# Patient Record
Sex: Female | Born: 1990
Health system: Southern US, Community
[De-identification: ages and names within clinical notes are randomized; demographics above are authoritative.]

## PROBLEM LIST (undated history)

## (undated) ENCOUNTER — Inpatient Hospital Stay (HOSPITAL_COMMUNITY): Payer: Self-pay

## (undated) DIAGNOSIS — N390 Urinary tract infection, site not specified: Secondary | ICD-10-CM

## (undated) DIAGNOSIS — F419 Anxiety disorder, unspecified: Secondary | ICD-10-CM

## (undated) HISTORY — PX: OTHER SURGICAL HISTORY: SHX169

## (undated) HISTORY — PX: WISDOM TOOTH EXTRACTION: SHX21

---
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
## 2009-05-29 ENCOUNTER — Encounter: Admission: RE | Admit: 2009-05-29 | Discharge: 2009-05-29 | Payer: Self-pay | Admitting: Family Medicine

## 2010-01-08 IMAGING — CR DG THORACOLUMBAR SPINE STANDING SCOLIOSIS
1 series · 3 of 3 positions shown · non-contrast
Comparison: None

CLINICAL DATA: Suspect scoliosis.  Back pain.

THORACOLUMBAR SCOLIOSIS STUDY - STANDING VIEWS

[Series 1001: view not recorded · 0.40mm/px · 3 of 3 slices shown]
[im 1/3]
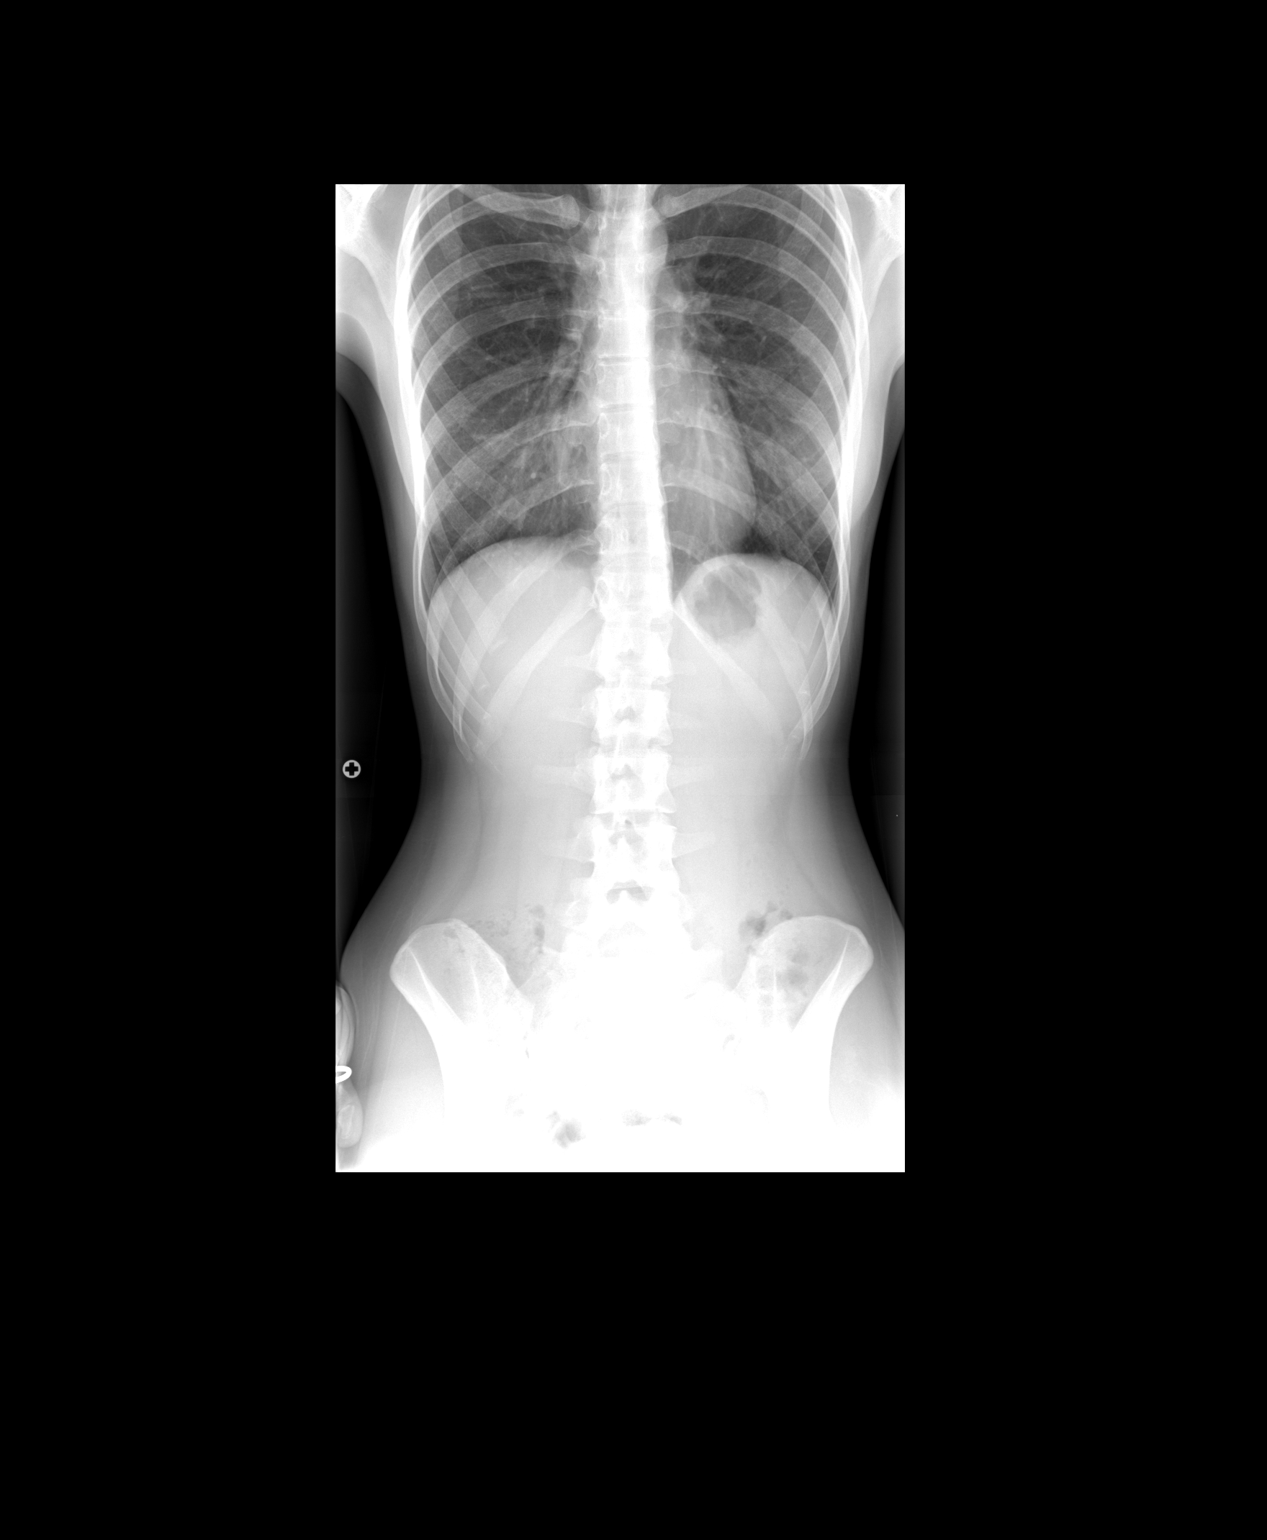
[im 2/3]
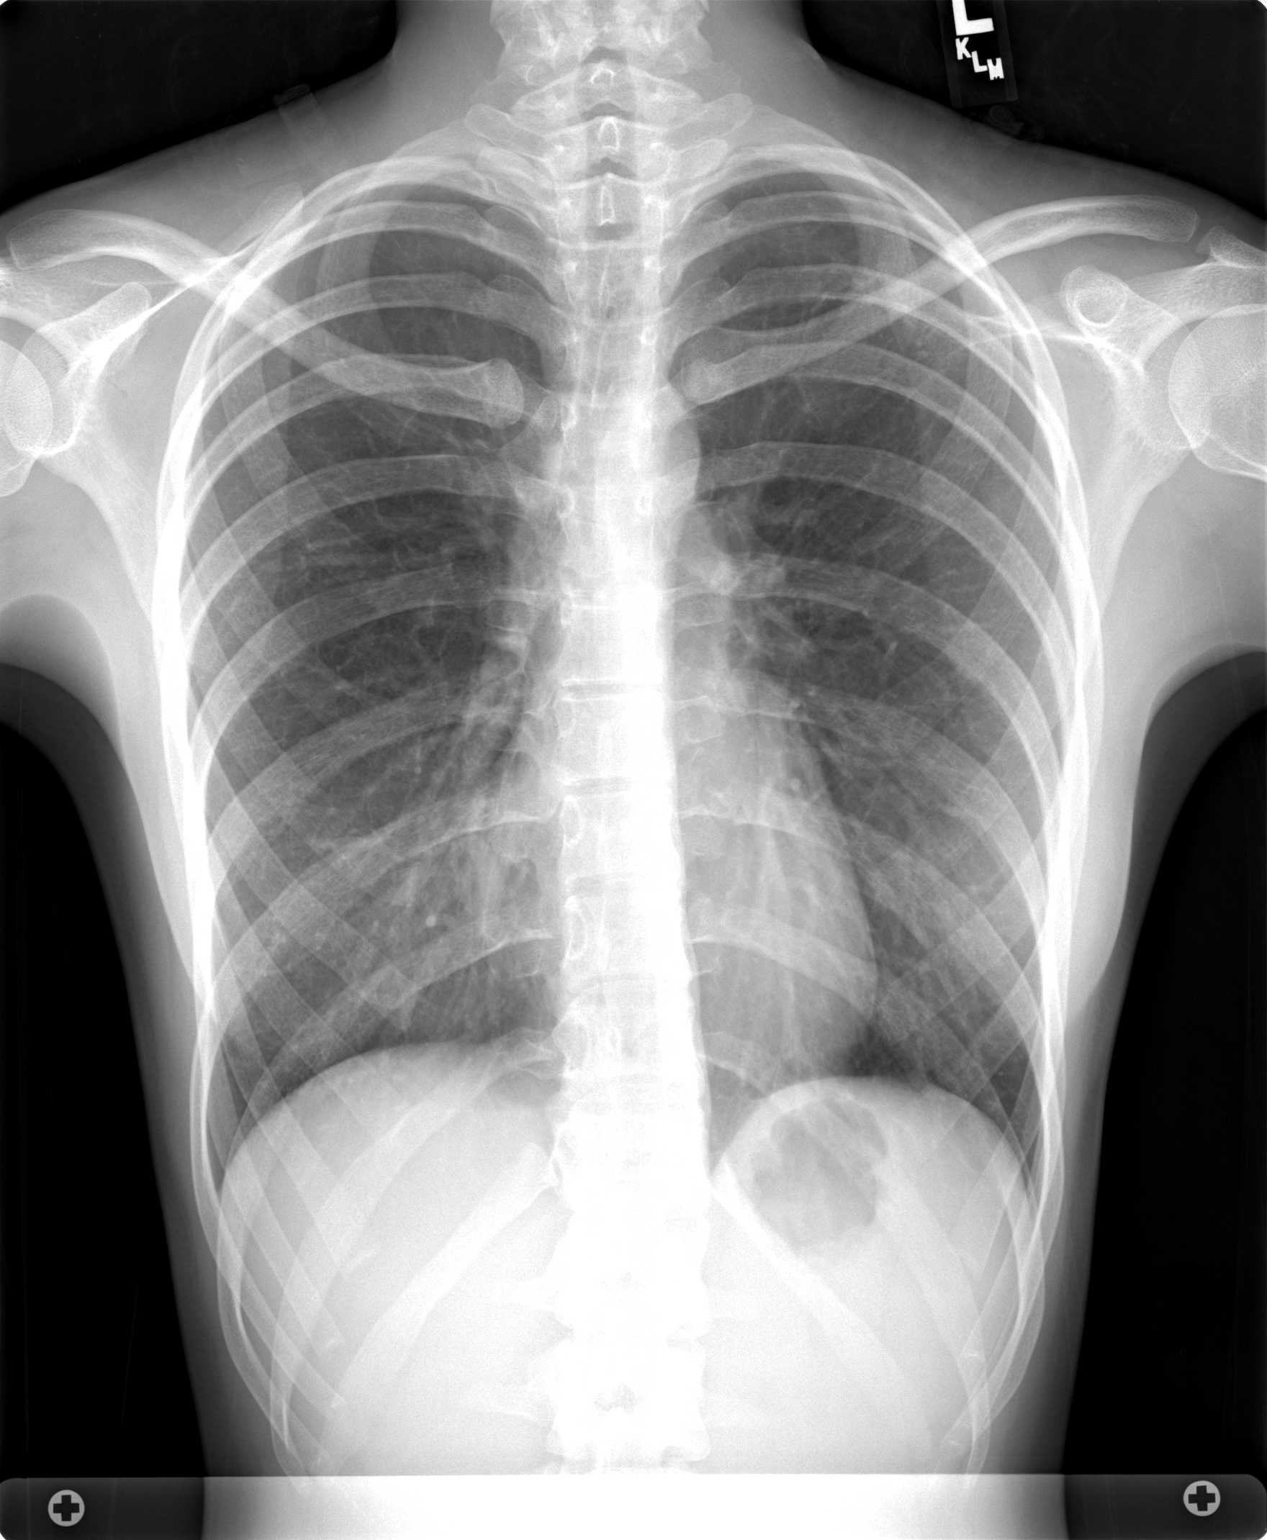
[im 3/3]
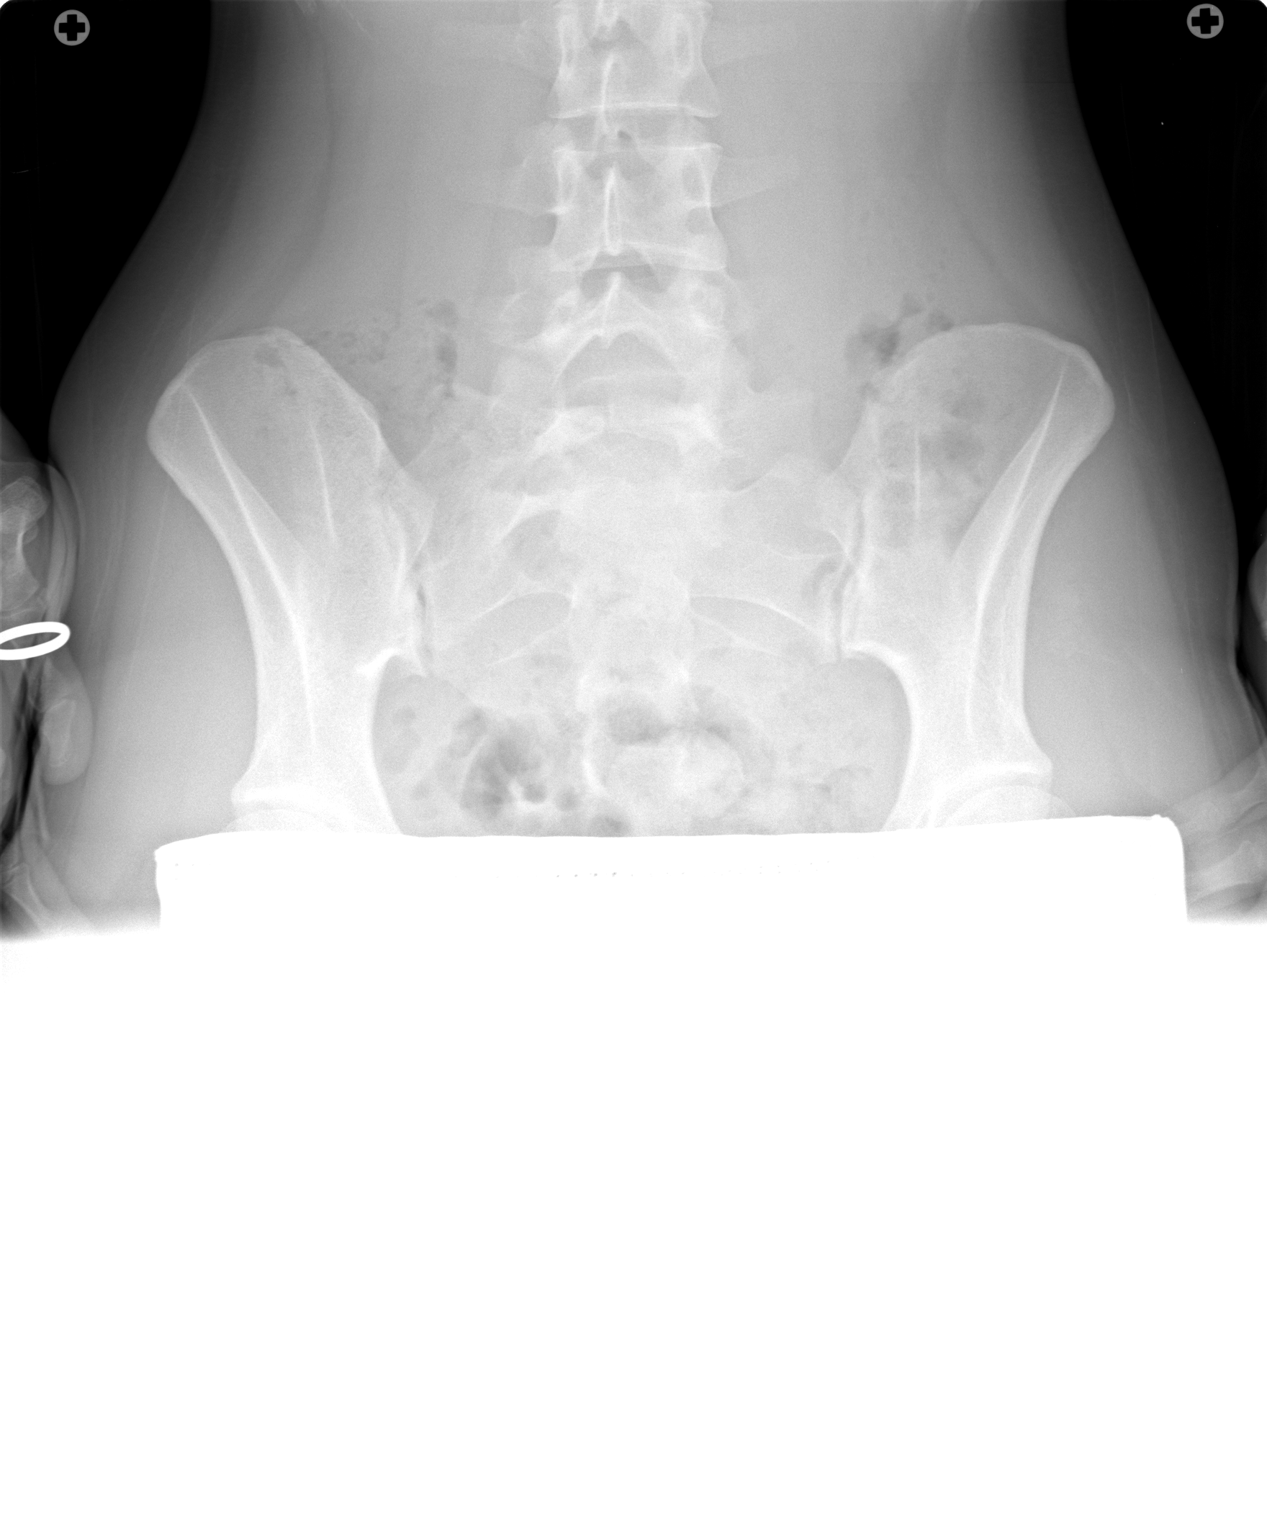

[3 of 3 positions shown; findings below may reference images not displayed]

FINDINGS: No significant scoliosis is noted.  There is very minimal
scoliotic curvature with convexity to the left in the lower
thoracic / upper lumbar region.  No segmentation anomalies.  No
dysraphism.
IMPRESSION: Minimal thoracolumbar levoscoliosis.

## 2011-05-01 NOTE — Consult Note (Signed)
[facility]  Patient:    Laura Hall, Laura Hall Visit Number: [id number] MRN: [id number]          Service Type: EXP Location: ED Attending Physician:  Pearletha Alfred Dictated by:   Jeannett Senior Pollyann Kennedy, M.D. Proc. Date: 09/03/01 Admit Date:  09/03/2001 Discharge Date: 09/03/2001               Sallye Ober A. Twiselton, M.D.   Consultation Report  TIME OF CONSULTATION:  2 p.m., September 03, 2001.  SITE:  [facility]  REASON FOR CONSULTATION:  Nasal injury, suspicious for septal hematoma.  REFERRING PHYSICIAN:  Louise A. Twiselton, M.D.  HISTORY:  This is a 20 year old girl who was hit in the nose by a softball about 10 oclock this morning.  She was seen in the pediatricians office, and there was concern about the possibility of a septal hematoma.  She seems to be breathing reasonably well through her nose.  There is minimal bleeding right now.  PAST MEDICAL HISTORY:  Recent ear infection on the left side and currently is on antibiotic treatment for that.  She is otherwise in good health.  PHYSICAL EXAMINATION:  GENERAL:  She is a pleasant, cooperative, healthy-appearing young lady.  HEENT:  Examination of the ear reveals a serous otitis media on the left side without any evidence of acute infection or retraction or granulation tissue. The right side looks clear.  Intranasal exam looks clear.  There are small scattered areas of some bloody discharge but no active bleeding.  The septum is thin and midline without any evidence of hematoma.  The nasal dorsum is ecchymotic on the left side in particular with some ecchymosis up into the medial periorbital eyelid area. There appears that there may be slight depression of the left nasal bone and probable lateral displacement of the right nasal bone.  The remainder of the face is unremarkable.  There is no evidence of orbital rim fracture, no hypesthesia of the mid face or forehead  area.  IMPRESSION:  Nasal trauma, possible displaced nasal fracture.  No evidence of septal hematoma.  RECOMMENDATIONS:  Ice the nose for the next 24 to 48 hours.  Recommend followup in the office in the middle of next week for reevaluation after the swelling has gone down to see if a closed reduction will be necessary. Dictated by:   Jeannett Senior Pollyann Kennedy, M.D. Attending Physician:  [place] B DD:  09/03/01 TD:  09/03/01 Job: [id number]

## 2013-01-23 ENCOUNTER — Emergency Department
Admission: EM | Admit: 2013-01-23 | Discharge: 2013-01-23 | Disposition: A | Discharge status: Home / Self Care | Payer: 59 | Source: Home / Self Care | Attending: Family Medicine | Admitting: Family Medicine

## 2013-01-23 ENCOUNTER — Encounter: Payer: Self-pay | Admitting: *Deleted

## 2013-01-23 DIAGNOSIS — J029 Acute pharyngitis, unspecified: Secondary | ICD-10-CM

## 2013-01-23 DIAGNOSIS — J069 Acute upper respiratory infection, unspecified: Secondary | ICD-10-CM

## 2013-01-23 HISTORY — DX: Anxiety disorder, unspecified: F41.9

## 2013-01-23 LAB — POCT RAPID STREP A (OFFICE): Rapid Strep A Screen: NEGATIVE

## 2013-01-23 MED ORDER — BENZONATATE 200 MG PO CAPS: 200.0000 mg | ORAL_CAPSULE | Freq: Every day | ORAL | Status: DC

## 2013-01-23 MED ORDER — AMOXICILLIN 875 MG PO TABS: 875.0000 mg | ORAL_TABLET | Freq: Two times a day (BID) | ORAL | Status: DC

## 2013-01-23 NOTE — ED Notes (Signed)
Patient c/o congestion, sinus and teeth pain, HA x 3 days. Taken Dayquil today. Denies fever.

## 2013-01-23 NOTE — ED Provider Notes (Signed)
History     CSN: [id number]  Arrival date & time 01/23/13  1411   None     Chief Complaint  Patient presents with  . Sinus Problem       HPI Comments: Patient complains of 3 day history of gradually progressive URI symptoms beginning with a mild sore throat (now improved), followed by progressive nasal congestion.  A mild cough started yesterday. Complains of fatigue but no myalgias.  Cough is now worse at night and generally non-productive during the day.  There has been no pleuritic pain, shortness of breath, or wheezes.   The history is provided by the patient.    Past Medical History  Diagnosis Date  . Anxiety     Past Surgical History  Procedure Laterality Date  . Nose set    . Wisdom tooth extraction      Family History  Problem Relation Age of Onset  . Hypertension Father   . Hyperlipidemia Father     History  Substance Use Topics  . Smoking status: Never Smoker   . Smokeless tobacco: Never Used  . Alcohol Use: Yes    OB History   Grav Para Term Preterm Abortions TAB SAB Ect Mult Living                  Review of Systems + sore throat + cough No pleuritic pain No wheezing + nasal congestion + post-nasal drainage + sinus pain/pressure No itchy/red eyes + earache No hemoptysis No SOB No fever/chills No nausea No vomiting No abdominal pain No diarrhea No urinary symptoms No skin rashes + fatigue No myalgias + headache Used OTC meds without relief  Allergies  Review of patient's allergies indicates no known allergies.  Home Medications   Current Outpatient Rx  Name  Route  Sig  Dispense  Refill  . citalopram (CELEXA) 20 MG tablet   Oral   Take 20 mg by mouth daily.         Marland Kitchen etonogestrel-ethinyl estradiol (NUVARING) 0.12-0.015 MG/24HR vaginal ring   Vaginal   Place 1 each vaginally every 28 (twenty-eight) days. Insert vaginally and leave in place for 3 consecutive weeks, then remove for 1 week.         Marland Kitchen amoxicillin  (AMOXIL) 875 MG tablet   Oral   Take 1 tablet (875 mg total) by mouth 2 (two) times daily. (Rx void after 01/31/13)   20 tablet   0   . benzonatate (TESSALON) 200 MG capsule   Oral   Take 1 capsule (200 mg total) by mouth at bedtime. Take as needed for cough   12 capsule   0     BP 122/85  Pulse 73  Temp(Src) 98.5 F (36.9 C) (Oral)  Ht 5\' 8"  (1.727 m)  Wt 136 lb (61.689 kg)  BMI 20.68 kg/m2  SpO2 98%  LMP 01/20/2013  Physical Exam Nursing notes and Vital Signs reviewed. Appearance:  Patient appears healthy, stated age, and in no acute distress Eyes:  Pupils are equal, round, and reactive to light and accomodation.  Extraocular movement is intact.  Conjunctivae are not inflamed  Ears:  Canals normal.  Tympanic membranes normal.  Nose:  Mildly congested turbinates.  No sinus tenderness.   Pharynx:  Normal Neck:  Supple.  Slightly tender shotty posterior nodes are palpated bilaterally  Lungs:  Clear to auscultation.  Breath sounds are equal.  Heart:  Regular rate and rhythm without murmurs, rubs, or gallops.  Abdomen:  Nontender without  masses or hepatosplenomegaly.  Bowel sounds are present.  No CVA or flank tenderness.  Extremities:  No edema.  No calf tenderness Skin:  No rash present.   ED Course  Procedures none   Labs Reviewed  STREP A DNA PROBE negative  POCT RAPID STREP A (OFFICE) pending      1. Acute upper respiratory infections of unspecified site   2. Acute pharyngitis       MDM  Throat culture pending There is no evidence of bacterial infection today.   Treat symptomatically for now: Take Mucinex D (guaifenesin with decongestant) twice daily for congestion (or use plain Mucinex and Sudafed).  Increase fluid intake, rest. May use Afrin nasal spray (or generic oxymetazoline) twice daily for about 5 days.  Also recommend using saline nasal spray several times daily and saline nasal irrigation (AYR is a common brand) Stop all antihistamines for now,  and other non-prescription cough/cold preparations. Begin Amoxicillin if not improving about 5 days or if persistent fever develops (Given a prescription to hold, with an expiration date)  Follow-up with family doctor if not improving about10 days.         Lattie Haw, MD 01/27/13 [phone]

## 2013-01-24 LAB — STREP A DNA PROBE: GASP: NEGATIVE

## 2013-05-19 ENCOUNTER — Telehealth: Payer: Self-pay | Admitting: Obstetrics and Gynecology

## 2013-05-19 NOTE — Telephone Encounter (Signed)
Yes, she certainly may use nuva back to back.

## 2013-05-19 NOTE — Telephone Encounter (Signed)
Nuvaring question for nurse: Can she use back to back instead of taking a week's break?

## 2013-05-19 NOTE — Telephone Encounter (Signed)
PATIENT NOTIFIED OF DR. Tresa Res RESPONSE CONCERNING NUVARING.

## 2013-06-21 ENCOUNTER — Telehealth: Payer: Self-pay | Admitting: Obstetrics and Gynecology

## 2013-06-21 NOTE — Telephone Encounter (Signed)
Nuvaring--question about lubricants to use with? Patient getting ready to go on honeymoon.

## 2013-06-21 NOTE — Telephone Encounter (Signed)
Patient last AEX 11/16/2012 with instructions of Nuvaring. Getting married this month and will be on honeymoon. Needing to know what kind of lubricant she may use if needed for SA or is this OK to do with a Nuvaring of using lubricant. States she is having no problems with nuvaring. please advise. Chart in your cabinet.

## 2013-06-22 ENCOUNTER — Telehealth: Payer: Self-pay | Admitting: Obstetrics and Gynecology

## 2013-06-22 NOTE — Telephone Encounter (Signed)
pt is still waiting since yesterday for a call from the nurse

## 2013-06-23 NOTE — Telephone Encounter (Signed)
Patient notified of response of Dr. Tresa Res of [place] to use [facility]. Patient understands.

## 2013-06-23 NOTE — Telephone Encounter (Signed)
Ok to use [employer]

## 2013-11-15 ENCOUNTER — Telehealth: Payer: Self-pay | Admitting: *Deleted

## 2013-11-15 ENCOUNTER — Telehealth: Payer: Self-pay | Admitting: Gynecology

## 2013-11-15 MED ORDER — ETONOGESTREL-ETHINYL ESTRADIOL 0.12-0.015 MG/24HR VA RING: VAGINAL_RING | VAGINAL | Status: DC

## 2013-11-15 NOTE — Telephone Encounter (Signed)
Pt requesting refill on the Nuvaring. [phone]. Rx may have come under her married name of Igou.

## 2013-11-15 NOTE — Telephone Encounter (Signed)
Annual exam scheduled for 12/29/12 Rx sent to [facility] . Nuvaring 1 x 1 refill.

## 2013-12-27 ENCOUNTER — Encounter: Payer: Self-pay | Admitting: Obstetrics and Gynecology

## 2013-12-29 ENCOUNTER — Ambulatory Visit (INDEPENDENT_AMBULATORY_CARE_PROVIDER_SITE_OTHER): Payer: 59 | Admitting: Gynecology

## 2013-12-29 ENCOUNTER — Encounter: Payer: Self-pay | Admitting: Gynecology

## 2013-12-29 VITALS — BP 104/68 | HR 74 | Resp 16 | Ht 69.25 in | Wt 132.0 lb

## 2013-12-29 DIAGNOSIS — Z01419 Encounter for gynecological examination (general) (routine) without abnormal findings: Secondary | ICD-10-CM

## 2013-12-29 DIAGNOSIS — Z124 Encounter for screening for malignant neoplasm of cervix: Secondary | ICD-10-CM

## 2013-12-29 DIAGNOSIS — Z309 Encounter for contraceptive management, unspecified: Secondary | ICD-10-CM

## 2013-12-29 MED ORDER — ETONOGESTREL-ETHINYL ESTRADIOL 0.12-0.015 MG/24HR VA RING: VAGINAL_RING | VAGINAL | Status: DC

## 2013-12-29 NOTE — Progress Notes (Signed)
23 y.o. [name]   G0P0000 here for annual exam. Pt is currently sexually active.  She reports using condoms when she's on her off week of Nuvaring.  First sexual activity at 1926 , 1 number of lifetime partners   Pt was given rx for bactrim after sex, but hasn't used 1 month ago.  Pt happy with Nurvaring.  Patient's last menstrual period was 12/05/2013.          Sexually active: yes  The current method of family planning is NuvaRing vaginal inserts.    Exercising: yes  treadmill, yoga occ Last pap: 05/26/2010  Alcohol: 1-2 drinks/wl (socially) Tobacco: no Drugs: no Gardisil: yes, completed: 2009  Labs: PCP    Health Maintenance  Topic Date Due  . Pap Smear  06/16/2007  . Tetanus/tdap  06/15/2008  . Influenza Vaccine  07/14/2013    Family History  Problem Relation Age of Onset  . Hypertension Father   . Hyperlipidemia Father   . Hypertension Paternal Grandfather     There are no active problems to display for this patient.   Past Medical History  Diagnosis Date  . Anxiety     Past Surgical History  Procedure Laterality Date  . Nose set    . Wisdom tooth extraction      Allergies: Review of patient's allergies indicates no known allergies.  Current Outpatient Prescriptions  Medication Sig Dispense Refill  . etonogestrel-ethinyl estradiol (NUVARING) 0.12-0.015 MG/24HR vaginal ring Insert vaginally and leave in place for 3 consecutive weeks, then remove for 1 week.  1 each  1  . SERTRALINE HCL PO Take 35 mg by mouth.      . sulfamethoxazole-trimethoprim (BACTRIM DS) 800-160 MG per tablet Take 1 tablet by mouth as needed.      . Adapalene-Benzoyl Peroxide (EPIDUO) 0.1-2.5 % gel Apply topically as needed.      Marland Kitchen. amoxicillin (AMOXIL) 875 MG tablet Take 1 tablet (875 mg total) by mouth 2 (two) times daily. (Rx void after 01/31/13)  20 tablet  0  . benzonatate (TESSALON) 200 MG capsule Take 1 capsule (200 mg total) by mouth at bedtime. Take as needed for cough   12 capsule  0  . citalopram (CELEXA) 20 MG tablet Take 20 mg by mouth daily.       No current facility-administered medications for this visit.    ROS: Pertinent items are noted in HPI.  Exam:    BP 104/68  Pulse 74  Resp 16  Ht 5' 9.25" (1.759 m)  Wt 132 lb (59.875 kg)  BMI 19.35 kg/m2  LMP 12/05/2013 Weight change: @WEIGHTCHANGE @ Last 3 height recordings:  Ht Readings from Last 3 Encounters:  12/29/13 5' 9.25" (1.759 m)  01/23/13 5\' 8"  (1.727 m)   General appearance: alert, cooperative and appears stated age Head: Normocephalic, without obvious abnormality, atraumatic Neck: no adenopathy, no carotid bruit, no JVD, supple, symmetrical, trachea midline and thyroid not enlarged, symmetric, no tenderness/mass/nodules Lungs: clear to auscultation bilaterally Breasts: normal appearance, no masses or tenderness Heart: regular rate and rhythm, S1, S2 normal, no murmur, click, rub or gallop Abdomen: soft, non-tender; bowel sounds normal; no masses,  no organomegaly Extremities: extremities normal, atraumatic, no cyanosis or edema Skin: Skin color, texture, turgor normal. No rashes or lesions Lymph nodes: Cervical, supraclavicular, and axillary nodes normal. no inguinal nodes palpated Neurologic: Grossly normal   Pelvic: External genitalia:  no lesions              Urethra: normal  appearing urethra with no masses, tenderness or lesions              Bartholins and Skenes: normal                 Vagina: normal appearing vagina with normal color and discharge, no lesions              Cervix: normal appearance              Pap taken: yes        Bimanual Exam:  Uterus:  uterus is normal size, shape, consistency and nontender                                      Adnexa:    normal adnexa in size, nontender and no masses                                      Rectovaginal: Confirms                                      Anus:  normal sphincter tone, no lesions  A: well woman no  contraindication to continue use of oral contraceptives Contraceptive management     P: pap smear with reflex counseled on breast self exam, mammography screening, adequate intake of calcium and vitamin D, diet and exercise return annually or prn Discussed STD prevention, regular condom use.     An After Visit Summary was printed and given to the patient.

## 2014-01-02 LAB — IPS PAP TEST WITH REFLEX TO HPV

## 2014-11-15 ENCOUNTER — Encounter: Payer: Self-pay | Admitting: Nurse Practitioner

## 2014-11-15 ENCOUNTER — Ambulatory Visit (INDEPENDENT_AMBULATORY_CARE_PROVIDER_SITE_OTHER): Payer: 59 | Admitting: Nurse Practitioner

## 2014-11-15 VITALS — BP 114/78 | HR 72 | Wt 138.0 lb

## 2014-11-15 DIAGNOSIS — B9689 Other specified bacterial agents as the cause of diseases classified elsewhere: Secondary | ICD-10-CM

## 2014-11-15 DIAGNOSIS — N760 Acute vaginitis: Secondary | ICD-10-CM

## 2014-11-15 DIAGNOSIS — N926 Irregular menstruation, unspecified: Secondary | ICD-10-CM

## 2014-11-15 DIAGNOSIS — A499 Bacterial infection, unspecified: Secondary | ICD-10-CM

## 2014-11-15 MED ORDER — METRONIDAZOLE 0.75 % VA GEL: 1.0000 | Freq: Every day | VAGINAL | Status: DC

## 2014-11-15 NOTE — Patient Instructions (Signed)
Bacterial Vaginosis Bacterial vaginosis is a vaginal infection that occurs when the normal balance of bacteria in the vagina is disrupted. It results from an overgrowth of certain bacteria. This is the most common vaginal infection in women of childbearing age. Treatment is important to prevent complications, especially in pregnant women, as it can cause a premature delivery. CAUSES  Bacterial vaginosis is caused by an increase in harmful bacteria that are normally present in smaller amounts in the vagina. Several different kinds of bacteria can cause bacterial vaginosis. However, the reason that the condition develops is not fully understood. RISK FACTORS Certain activities or behaviors can put you at an increased risk of developing bacterial vaginosis, including:  Having a new sex partner or multiple sex partners.  Douching.  Using an intrauterine device (IUD) for contraception. Women do not get bacterial vaginosis from toilet seats, bedding, swimming pools, or contact with objects around them. SIGNS AND SYMPTOMS  Some women with bacterial vaginosis have no signs or symptoms. Common symptoms include:  Grey vaginal discharge.  A fishlike odor with discharge, especially after sexual intercourse.  Itching or burning of the vagina and vulva.  Burning or pain with urination. DIAGNOSIS  Your health care provider will take a medical history and examine the vagina for signs of bacterial vaginosis. A sample of vaginal fluid may be taken. Your health care provider will look at this sample under a microscope to check for bacteria and abnormal cells. A vaginal pH test may also be done.  TREATMENT  Bacterial vaginosis may be treated with antibiotic medicines. These may be given in the form of a pill or a vaginal cream. A second round of antibiotics may be prescribed if the condition comes back after treatment.  HOME CARE INSTRUCTIONS   Only take over-the-counter or prescription medicines as  directed by your health care provider.  If antibiotic medicine was prescribed, take it as directed. Make sure you finish it even if you start to feel better.  Do not have sex until treatment is completed.  Tell all sexual partners that you have a vaginal infection. They should see their health care provider and be treated if they have problems, such as a mild rash or itching.  Practice safe sex by using condoms and only having one sex partner. SEEK MEDICAL CARE IF:   Your symptoms are not improving after 3 days of treatment.  You have increased discharge or pain.  You have a fever. MAKE SURE YOU:   Understand these instructions.  Will watch your condition.  Will get help right away if you are not doing well or get worse. FOR MORE INFORMATION  Centers for Disease Control and Prevention, Division of STD Prevention: www.cdc.gov/std American Sexual Health Association (ASHA): www.ashastd.org  Document Released: 11/30/2005 Document Revised: 09/20/2013 Document Reviewed: 07/12/2013 ExitCare Patient Information 2015 [employer]. This information is not intended to replace advice given to you by your health care provider. Make sure you discuss any questions you have with your health care provider.  

## 2014-11-15 NOTE — Progress Notes (Signed)
23 y.o. MW Fe G0 here with complaint of vaginal symptoms of menstrual irregularity with an odor since last menses.  Describes discharge as brown to light flow.  LMP 10/28/14.  On Nuva Ring for 1.5 years and cycles normally last for a week.  In August started menses earlier and lasted to 2 weeks.  She states she was tole]d to leave the Nuva ring in until the 28 th of the month regardless of where she was in the cycle and then remove.  Then instructed to insert a new ring on the first day of next month regardless of where she was on the cycle. Has been doing this about the same time of this irregular bleeding. Onset of symptoms about a few weeks ago days ago. Denies new personal products or vaginal dryness. No  STD concerns. Urinary symptoms none .   O:Healthy female WDWN Affect: normal, orientation x 3  Exam: Abdomen: Lymph node: no enlargement or tenderness Pelvic exam: External genital:  BUS: negative Vagina: thin clear to grey discharge noted. Wet prep taken Cervix: normal, non tender Uterus: normal, non tender   Wet Prep results: NSS: + clue cells, KOH negative yeast, PH: 5.0 UPT: negative  A:  BV  Irregular bleeding on Nuva Ring (negative UPT)  P:  Discussed findings of BV and etiology. Discussed Aveeno or baking soda sitz bath for comfort. Avoid moist clothes or pads for extended period of time. If working out in gym clothes or swim suits for long periods of time change underwear or bottoms of swimsuit if possible. Olive Oil use for skin protection prior to activity can be used to external skin.  Rx: Metrogel vagina cream HS X 5  She is advised to remove the Nuva Ring at three week cycle an reinsert a new ring a week later.  The BTB should improve wit this - if not to call back.  RV prn

## 2014-11-18 NOTE — Progress Notes (Signed)
Encounter reviewed by Dr. Brook Silva.  

## 2015-01-14 ENCOUNTER — Other Ambulatory Visit: Payer: Self-pay | Admitting: *Deleted

## 2015-01-14 MED ORDER — ETONOGESTREL-ETHINYL ESTRADIOL 0.12-0.015 MG/24HR VA RING: VAGINAL_RING | VAGINAL | Status: DC

## 2015-01-14 NOTE — Telephone Encounter (Signed)
Incoming fax from [facility] requesting Nuvaring refill  Medication refill request: Nuvaring Last AEX:  12/29/13 with TL Next AEX: 01/24/15 with DL Last MMG (if hormonal medication request): None Refill authorized: 12/29/13  #3/3R. Today #1/0R?   [facility]

## 2015-01-17 ENCOUNTER — Ambulatory Visit: Payer: 59 | Admitting: Nurse Practitioner

## 2015-01-24 ENCOUNTER — Ambulatory Visit (INDEPENDENT_AMBULATORY_CARE_PROVIDER_SITE_OTHER): Payer: 59 | Admitting: Certified Nurse Midwife

## 2015-01-24 ENCOUNTER — Encounter: Payer: Self-pay | Admitting: Certified Nurse Midwife

## 2015-01-24 VITALS — BP 112/66 | HR 68 | Resp 16 | Ht 67.75 in | Wt 138.0 lb

## 2015-01-24 DIAGNOSIS — Z01419 Encounter for gynecological examination (general) (routine) without abnormal findings: Secondary | ICD-10-CM

## 2015-01-24 DIAGNOSIS — B9689 Other specified bacterial agents as the cause of diseases classified elsewhere: Secondary | ICD-10-CM

## 2015-01-24 DIAGNOSIS — A499 Bacterial infection, unspecified: Secondary | ICD-10-CM

## 2015-01-24 DIAGNOSIS — Z3049 Encounter for surveillance of other contraceptives: Secondary | ICD-10-CM

## 2015-01-24 DIAGNOSIS — Z0000 Encounter for general adult medical examination without abnormal findings: Secondary | ICD-10-CM

## 2015-01-24 DIAGNOSIS — N760 Acute vaginitis: Secondary | ICD-10-CM

## 2015-01-24 MED ORDER — ETONOGESTREL-ETHINYL ESTRADIOL 0.12-0.015 MG/24HR VA RING: VAGINAL_RING | VAGINAL | Status: DC

## 2015-01-24 NOTE — Patient Instructions (Signed)
General topics  Next pap or exam is  due in 1 year Take a Women's multivitamin Take 1200 mg. of calcium daily - prefer dietary If any concerns in interim to call back  Breast Self-Awareness Practicing breast self-awareness may pick up problems early, prevent significant medical complications, and possibly save your life. By practicing breast self-awareness, you can become familiar with how your breasts look and feel and if your breasts are changing. This allows you to notice changes early. It can also offer you some reassurance that your breast health is good. One way to learn what is normal for your breasts and whether your breasts are changing is to do a breast self-exam. If you find a lump or something that was not present in the past, it is best to contact your caregiver right away. Other findings that should be evaluated by your caregiver include nipple discharge, especially if it is bloody; skin changes or reddening; areas where the skin seems to be pulled in (retracted); or new lumps and bumps. Breast pain is seldom associated with cancer (malignancy), but should also be evaluated by a caregiver. BREAST SELF-EXAM The best time to examine your breasts is 5 7 days after your menstrual period is over.  ExitCare Patient Information 2013 ExitCare, LLC.   Exercise to Stay Healthy Exercise helps you become and stay healthy. EXERCISE IDEAS AND TIPS Choose exercises that:  You enjoy.  Fit into your day. You do not need to exercise really hard to be healthy. You can do exercises at a slow or medium level and stay healthy. You can:  Stretch before and after working out.  Try yoga, Pilates, or tai chi.  Lift weights.  Walk fast, swim, jog, run, climb stairs, bicycle, dance, or rollerskate.  Take aerobic classes. Exercises that burn about 150 calories:  Running 1  miles in 15 minutes.  Playing volleyball for 45 to 60 minutes.  Washing and waxing a car for 45 to 60  minutes.  Playing touch football for 45 minutes.  Walking 1  miles in 35 minutes.  Pushing a stroller 1  miles in 30 minutes.  Playing basketball for 30 minutes.  Raking leaves for 30 minutes.  Bicycling 5 miles in 30 minutes.  Walking 2 miles in 30 minutes.  Dancing for 30 minutes.  Shoveling snow for 15 minutes.  Swimming laps for 20 minutes.  Walking up stairs for 15 minutes.  Bicycling 4 miles in 15 minutes.  Gardening for 30 to 45 minutes.  Jumping rope for 15 minutes.  Washing windows or floors for 45 to 60 minutes. Document Released: 01/02/2011 Document Revised: 02/22/2012 Document Reviewed: 01/02/2011 ExitCare Patient Information 2013 ExitCare, LLC.   Other topics ( that may be useful information):    Sexually Transmitted Disease Sexually transmitted disease (STD) refers to any infection that is passed from person to person during sexual activity. This may happen by way of saliva, semen, blood, vaginal mucus, or urine. Common STDs include:  Gonorrhea.  Chlamydia.  Syphilis.  HIV/AIDS.  Genital herpes.  Hepatitis B and C.  Trichomonas.  Human papillomavirus (HPV).  Pubic lice. CAUSES  An STD may be spread by bacteria, virus, or parasite. A person can get an STD by:  Sexual intercourse with an infected person.  Sharing sex toys with an infected person.  Sharing needles with an infected person.  Having intimate contact with the genitals, mouth, or rectal areas of an infected person. SYMPTOMS  Some people may not have any symptoms, but   they can still pass the infection to others. Different STDs have different symptoms. Symptoms include:  Painful or bloody urination.  Pain in the pelvis, abdomen, vagina, anus, throat, or eyes.  Skin rash, itching, irritation, growths, or sores (lesions). These usually occur in the genital or anal area.  Abnormal vaginal discharge.  Penile discharge in men.  Soft, flesh-colored skin growths in the  genital or anal area.  Fever.  Pain or bleeding during sexual intercourse.  Swollen glands in the groin area.  Yellow skin and eyes (jaundice). This is seen with hepatitis. DIAGNOSIS  To make a diagnosis, your caregiver may:  Take a medical history.  Perform a physical exam.  Take a specimen (culture) to be examined.  Examine a sample of discharge under a microscope.  Perform blood test TREATMENT   Chlamydia, gonorrhea, trichomonas, and syphilis can be cured with antibiotic medicine.  Genital herpes, hepatitis, and HIV can be treated, but not cured, with prescribed medicines. The medicines will lessen the symptoms.  Genital warts from HPV can be treated with medicine or by freezing, burning (electrocautery), or surgery. Warts may come back.  HPV is a virus and cannot be cured with medicine or surgery.However, abnormal areas may be followed very closely by your caregiver and may be removed from the cervix, vagina, or vulva through office procedures or surgery. If your diagnosis is confirmed, your recent sexual partners need treatment. This is true even if they are symptom-free or have a negative culture or evaluation. They should not have sex until their caregiver says it is okay. HOME CARE INSTRUCTIONS  All sexual partners should be informed, tested, and treated for all STDs.  Take your antibiotics as directed. Finish them even if you start to feel better.  Only take over-the-counter or prescription medicines for pain, discomfort, or fever as directed by your caregiver.  Rest.  Eat a balanced diet and drink enough fluids to keep your urine clear or pale yellow.  Do not have sex until treatment is completed and you have followed up with your caregiver. STDs should be checked after treatment.  Keep all follow-up appointments, Pap tests, and blood tests as directed by your caregiver.  Only use latex condoms and water-soluble lubricants during sexual activity. Do not use  petroleum jelly or oils.  Avoid alcohol and illegal drugs.  Get vaccinated for HPV and hepatitis. If you have not received these vaccines in the past, talk to your caregiver about whether one or both might be right for you.  Avoid risky sex practices that can break the skin. The only way to avoid getting an STD is to avoid all sexual activity.Latex condoms and dental dams (for oral sex) will help lessen the risk of getting an STD, but will not completely eliminate the risk. SEEK MEDICAL CARE IF:   You have a fever.  You have any new or worsening symptoms. Document Released: 02/20/2003 Document Revised: 02/22/2012 Document Reviewed: 02/27/2011 [facility] Patient Information 2013 [employer].    Domestic Abuse You are being battered or abused if someone close to you hits, pushes, or physically hurts you in any way. You also are being abused if you are forced into activities. You are being sexually abused if you are forced to have sexual contact of any kind. You are being emotionally abused if you are made to feel worthless or if you are constantly threatened. It is important to remember that help is available. No one has the right to abuse you. PREVENTION OF FURTHER  ABUSE  Learn the warning signs of danger. This varies with situations but may include: the use of alcohol, threats, isolation from friends and family, or forced sexual contact. Leave if you feel that violence is going to occur.  If you are attacked or beaten, report it to the police so the abuse is documented. You do not have to press charges. The police can protect you while you or the attackers are leaving. Get the officer's name and badge number and a copy of the report.  Find someone you can trust and tell them what is happening to you: your caregiver, a nurse, clergy member, close friend or family member. Feeling ashamed is natural, but remember that you have done nothing wrong. No one deserves abuse. Document Released:  11/27/2000 Document Revised: 02/22/2012 Document Reviewed: 02/05/2011 ExitCare Patient Information 2013 ExitCare[employer] LLC.    How Much is Too Much Alcohol? Drinking too much alcohol can cause injury, accidents, and health problems. These types of problems can include:   Car crashes.  Falls.  Family fighting (domestic violence).  Drowning.  Fights.  Injuries.  Burns.  Damage to certain organs.  Having a baby with birth defects. ONE DRINK CAN BE TOO MUCH WHEN YOU ARE:  Working.  Pregnant or breastfeeding.  Taking medicines. Ask your doctor.  Driving or planning to drive. If you or someone you know has a drinking problem, get help from a doctor.  Document Released: 09/26/2009 Document Revised: 02/22/2012 Document Reviewed: 09/26/2009 ExitCare Patient Information 2013 ExitCare[employer].   Smoking Hazards Smoking cigarettes is extremely bad for your health. Tobacco smoke has over 200 known poisons in it. There are over 60 chemicals in tobacco smoke that cause cancer. Some of the chemicals found in cigarette smoke include:   Cyanide.  Benzene.  Formaldehyde.  Methanol (wood alcohol).  Acetylene (fuel used in welding torches).  Ammonia. Cigarette smoke also contains the poisonous gases nitrogen oxide and carbon monoxide.  Cigarette smokers have an increased risk of many serious medical problems and Smoking causes approximately:  90% of all lung cancer deaths in men.  80% of all lung cancer deaths in women.  90% of deaths from chronic obstructive lung disease. Compared with nonsmokers, smoking increases the risk of:  Coronary heart disease by 2 to 4 times.  Stroke by 2 to 4 times.  Men developing lung cancer by 23 times.  Women developing lung cancer by 13 times.  Dying from chronic obstructive lung diseases by 12 times.  . Smoking is the most preventable cause of death and disease in our society.  WHY IS SMOKING ADDICTIVE?  Nicotine is the chemical  agent in tobacco that is capable of causing addiction or dependence.  When you smoke and inhale, nicotine is absorbed rapidly into the bloodstream through your lungs. Nicotine absorbed through the lungs is capable of creating a powerful addiction. Both inhaled and non-inhaled nicotine may be addictive.  Addiction studies of cigarettes and spit tobacco show that addiction to nicotine occurs mainly during the teen years, when young people begin using tobacco products. WHAT ARE THE BENEFITS OF QUITTING?  There are many health benefits to quitting smoking.   Likelihood of developing cancer and heart disease decreases. Health improvements are seen almost immediately.  Blood pressure, pulse rate, and breathing patterns start returning to normal soon after quitting. QUITTING SMOKING   American Lung Association - 1-800-LUNGUSA  American Cancer Society - 1-800-ACS-2345 Document Released: 01/07/2005 Document Revised: 02/22/2012 Document Reviewed: 09/11/2009 ExitCare Patient Information 2013 ExitCare,   LLC.   Stress Management Stress is a state of physical or mental tension that often results from changes in your life or normal routine. Some common causes of stress are:  Death of a loved one.  Injuries or severe illnesses.  Getting fired or changing jobs.  Moving into a new home. Other causes may be:  Sexual problems.  Business or financial losses.  Taking on a large debt.  Regular conflict with someone at home or at work.  Constant tiredness from lack of sleep. It is not just bad things that are stressful. It may be stressful to:  Win the lottery.  Get married.  Buy a new car. The amount of stress that can be easily tolerated varies from person to person. Changes generally cause stress, regardless of the types of change. Too much stress can affect your health. It may lead to physical or emotional problems. Too little stress (boredom) may also become stressful. SUGGESTIONS TO  REDUCE STRESS:  Talk things over with your family and friends. It often is helpful to share your concerns and worries. If you feel your problem is serious, you may want to get help from a professional counselor.  Consider your problems one at a time instead of lumping them all together. Trying to take care of everything at once may seem impossible. List all the things you need to do and then start with the most important one. Set a goal to accomplish 2 or 3 things each day. If you expect to do too many in a single day you will naturally fail, causing you to feel even more stressed.  Do not use alcohol or drugs to relieve stress. Although you may feel better for a short time, they do not remove the problems that caused the stress. They can also be habit forming.  Exercise regularly - at least 3 times per week. Physical exercise can help to relieve that "uptight" feeling and will relax you.  The shortest distance between despair and hope is often a good night's sleep.  Go to bed and get up on time allowing yourself time for appointments without being rushed.  Take a short "time-out" period from any stressful situation that occurs during the day. Close your eyes and take some deep breaths. Starting with the muscles in your face, tense them, hold it for a few seconds, then relax. Repeat this with the muscles in your neck, shoulders, hand, stomach, back and legs.  Take good care of yourself. Eat a balanced diet and get plenty of rest.  Schedule time for having fun. Take a break from your daily routine to relax. HOME CARE INSTRUCTIONS   Call if you feel overwhelmed by your problems and feel you can no longer manage them on your own.  Return immediately if you feel like hurting yourself or someone else. Document Released: 05/26/2001 Document Revised: 02/22/2012 Document Reviewed: 01/16/2008 ExitCare Patient Information 2013 ExitCare, LLC.   

## 2015-01-24 NOTE — Progress Notes (Signed)
24 y.o. G0P0 Married Caucasian Fe g0p0 here for annual exam. Periods normal now after adjustment with Nuvraing. Happy with choice. Sees Dr.Gurley for aex and yearly labs. Treated for BV 11/16/15 and completed medication. No symptoms, but would like to be checked. No other health concerns today. 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  Patient's last menstrual period was 01/04/2015.          Sexually active: Yes.    The current method of family planning is NuvaRing vaginal inserts.    Exercising: Yes.    gym Smoker:  no  Health Maintenance: Pap:  12-29-13 neg MMG:  none Colonoscopy:  none BMD:   none TDaP:  2008 Labs: none Self breast exam: done monthly   reports that she has never smoked. She has never used smokeless tobacco. She reports that she drinks about 3.0 oz of alcohol per week. She reports that she does not use illicit drugs.  Past Medical History  Diagnosis Date  . Anxiety     Past Surgical History  Procedure Laterality Date  . Nose set    . Wisdom tooth extraction      Current Outpatient Prescriptions  Medication Sig Dispense Refill  . etonogestrel-ethinyl estradiol (NUVARING) 0.12-0.015 MG/24HR vaginal ring Insert vaginally and leave in place for 3 consecutive weeks, then remove for 1 week. 1 each 0  . sulfamethoxazole-trimethoprim (BACTRIM DS) 800-160 MG per tablet Take 1 tablet by mouth as needed (as needed for UTI).      No current facility-administered medications for this visit.    Family History  Problem Relation Age of Onset  . Hypertension Father   . Hyperlipidemia Father   . Hypertension Paternal Grandfather     ROS:  Pertinent items are noted in HPI.  Otherwise, a comprehensive ROS was negative.  Exam:   BP 112/66 mmHg  Pulse 68  Resp 16  Ht 5' 7.75" (1.721 m)  Wt 138 lb (62.596 kg)  BMI 21.13 kg/m2  LMP 01/04/2015 Height: 5' 7.75" (172.1 cm) Ht Readings from Last 3 Encounters:  01/24/15 5' 7.75" (1.721 m)  12/29/13 5' 9.25" (1.759 m)  01/23/13 5'  8" (1.727 m)    General appearance: alert, cooperative and appears stated age Head: Normocephalic, without obvious abnormality, atraumatic Neck: no adenopathy, supple, symmetrical, trachea midline and thyroid normal to inspection and palpation Lungs: clear to auscultation bilaterally Breasts: normal appearance, no masses or tenderness, No nipple retraction or dimpling, No nipple discharge or bleeding, No axillary or supraclavicular adenopathy Heart: regular rate and rhythm Abdomen: soft, non-tender; no masses,  no organomegaly Extremities: extremities normal, atraumatic, no cyanosis or edema Skin: Skin color, texture, turgor normal. No rashes or lesions Lymph nodes: Cervical, supraclavicular, and axillary nodes normal. No abnormal inguinal nodes palpated Neurologic: Grossly normal   Pelvic: External genitalia:  no lesions              Urethra:  normal appearing urethra with no masses, tenderness or lesions              Bartholin's and Skene's: normal                 Vagina: normal appearing vagina with normal color and discharge, no lesions ph 3.5, wet prep taken              Cervix: normal, non tender, no lesions              Pap taken: Yes.   Bimanual Exam:  Uterus:  normal  size, contour, position, consistency, mobility, non-tender              Adnexa: normal adnexa and no mass, fullness, tenderness               Rectovaginal: Confirms               Anus:  Normal appearance  Wet prep negative for pathogens   A:  Well Woman with normal exam  Contraception Nuvaring  BV resolved   P:   Reviewed health and wellness pertinent to exam  Pap smear not taken today  Reassured BV resolved   counseled on breast self exam, adequate intake of calcium and vitamin D, diet and exercise  return annually or prn  An After Visit Summary was printed and given to the patient.

## 2015-01-25 NOTE — Progress Notes (Signed)
Reviewed personally.  M. Suzanne Kerrie Timm, MD.  

## 2015-04-05 ENCOUNTER — Telehealth: Payer: Self-pay | Admitting: Certified Nurse Midwife

## 2015-04-05 MED ORDER — SULFAMETHOXAZOLE-TRIMETHOPRIM 800-160 MG PO TABS: 1.0000 | ORAL_TABLET | Freq: Two times a day (BID) | ORAL | Status: DC

## 2015-04-05 NOTE — Telephone Encounter (Signed)
OK for Bactrim DS 1 po bid for 5 days.  Dispense 10, RF none.   Needs a test of cure in 10 days and a consultation with Sara Chuebbie Leonard to discuss recurrent infections and determine if further evaluation is needed.

## 2015-04-05 NOTE — Telephone Encounter (Signed)
Spoke with patient. 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 Patient is on Bactrim 800-160 mg PRN post coital. Patient's PCP usually prescribes this but has left the practice. Patient is out of medication and is requesting a refill from our office in the interim. "I was seen more recently with you and I am having some burning and odor now again. I was hoping I could get a refill to help." Denies any fevers, chills, or lower back pain. Advised will speak with covering provider and return call with further recommendations. Patient is agreeable. Last seen for aex on 01/24/2015 with Verner Choleborah S. Leonard CNM. Chronic UTI and bactrim noted in 12/29/2013 aex with Dr.Lathrop.

## 2015-04-05 NOTE — Telephone Encounter (Signed)
Spoke with patient. Advised of message as seen below from Dr.Silva. Patient is agreeable and verbalizes understanding. TOC appointment scheduled for 5/5 at 4pm with Verner Choleborah S. Leonard CNM. Patient is agreeable to date and time. [facility] Bactrim #10 [facility]RF [facility].   Routing to provider for final review. Patient agreeable to disposition. Will close encounter

## 2015-04-05 NOTE — Telephone Encounter (Signed)
Pt says she has a uti

## 2015-04-18 ENCOUNTER — Encounter: Payer: Self-pay | Admitting: Certified Nurse Midwife

## 2015-04-18 ENCOUNTER — Ambulatory Visit (INDEPENDENT_AMBULATORY_CARE_PROVIDER_SITE_OTHER): Payer: 59 | Admitting: Certified Nurse Midwife

## 2015-04-18 VITALS — BP 90/60 | HR 60 | Ht 67.75 in | Wt 134.0 lb

## 2015-04-18 DIAGNOSIS — N390 Urinary tract infection, site not specified: Secondary | ICD-10-CM | POA: Diagnosis not present

## 2015-04-18 DIAGNOSIS — R8290 Unspecified abnormal findings in urine: Secondary | ICD-10-CM | POA: Diagnosis not present

## 2015-04-18 LAB — POCT URINALYSIS DIPSTICK
BILIRUBIN UA: NEGATIVE
Blood, UA: NEGATIVE
GLUCOSE UA: NEGATIVE
KETONES UA: NEGATIVE
Leukocytes, UA: NEGATIVE
NITRITE UA: NEGATIVE
PROTEIN UA: NEGATIVE
UROBILINOGEN UA: NEGATIVE
pH, UA: 7

## 2015-04-18 NOTE — Patient Instructions (Signed)

## 2015-04-18 NOTE — Progress Notes (Signed)
23 y.o.Married white female g0p0 here for follow up of UTI treated per called in Rx. Patient completed the Bactrim as prescribed. All frequency and urgency and burning has resolved. Slight urine odor, but has not had a good amount of fluids today. History of post coital UTI and did not have medication. No vaginal symptoms.  O: Healthy female WDWN Affect: Normal, orientation x 3 Skin : warm and dry CVAT: negative bilateral Abdomen: negative for suprapubic tenderness  Pelvic exam: External genital area: normal, no lesions Bladder,Urethra non tender, Urethral meatus: non tender,no  redness Vagina: normal vaginal discharge, normal appearance  Wet prep not taken Cervix: normal, non tender Uterus:normal,non tender Adnexa: normal non tender, no fullness or masses   A: UTI probably resolved Normal pelvic exam History of post coital UTI  P: Reviewed findings of UTI probably resolved. ?Bactrim resistance MWU:XLKGMLab:Urine  culture Reviewed warning signs and symptoms of UTI and need to advise if occurring. Encouraged to limit soda, tea, and coffee and increase water. Discussed emptying bladder post coital and also starting on daily cranberry tablets. Limit thong use, which can also contribute to occurrence. Consider Macrobid use if culture positive.   RV prn

## 2015-04-20 LAB — URINE CULTURE: Colony Count: 10000

## 2015-04-20 NOTE — Progress Notes (Signed)
Reviewed personally.  M. Suzanne Gifford Ballon, MD.  

## 2015-04-26 ENCOUNTER — Other Ambulatory Visit: Payer: Self-pay | Admitting: Certified Nurse Midwife

## 2015-04-26 DIAGNOSIS — N390 Urinary tract infection, site not specified: Secondary | ICD-10-CM

## 2015-04-26 MED ORDER — NITROFURANTOIN MONOHYD MACRO 100 MG PO CAPS: ORAL_CAPSULE | ORAL | Status: DC

## 2015-06-13 ENCOUNTER — Telehealth: Payer: Self-pay

## 2015-06-13 NOTE — Telephone Encounter (Signed)
Spoke with patient at time of incoming call. Patient states that she has a history of recurring UTI's. Over the last two weeks has been experiencing intermittent lower back pain and odor to urine. Denies any discomfort with urination, fever, or chills. Patient is on post coital Macrobid. Last took Macrobid on 6/26. Is currently on Nuvaring for contraception. Is not currently experiencing any pain. Advised will need to be seen in office for further evaluation with Verner Choleborah S. Leonard CNM. Patient is agreeable. Appointment scheduled for tomorrow 7/1 at 10am with Verner Choleborah S. Leonard CNM. Patient is agreeable to date and time. Advised if symptoms worsen over night will need to seek care at local urgent care or ER.  Routing to provider for final review. Patient agreeable to disposition. Will close encounter.   Patient aware provider will review message and nurse will return call if any additional advice or change of disposition.

## 2015-06-14 ENCOUNTER — Ambulatory Visit (INDEPENDENT_AMBULATORY_CARE_PROVIDER_SITE_OTHER): Payer: 59 | Admitting: Certified Nurse Midwife

## 2015-06-14 VITALS — BP 90/66 | HR 72 | Temp 98.2°F | Ht 67.75 in | Wt 134.2 lb

## 2015-06-14 DIAGNOSIS — R109 Unspecified abdominal pain: Secondary | ICD-10-CM

## 2015-06-14 DIAGNOSIS — N390 Urinary tract infection, site not specified: Secondary | ICD-10-CM

## 2015-06-14 LAB — POCT URINALYSIS DIPSTICK
Leukocytes, UA: NEGATIVE
PH UA: 5
Urobilinogen, UA: NEGATIVE

## 2015-06-14 NOTE — Patient Instructions (Signed)
Flank Pain °Flank pain refers to pain that is located on the side of the body between the upper abdomen and the back. The pain may occur over a short period of time (acute) or may be long-term or reoccurring (chronic). It may be mild or severe. Flank pain can be caused by many things. °CAUSES  °Some of the more common causes of flank pain include: °· Muscle strains.   °· Muscle spasms.   °· A disease of your spine (vertebral disk disease).   °· A lung infection (pneumonia).   °· Fluid around your lungs (pulmonary edema).   °· A kidney infection.   °· Kidney stones.   °· A very painful skin rash caused by the chickenpox virus (shingles).   °· Gallbladder disease.   °HOME CARE INSTRUCTIONS  °Home care will depend on the cause of your pain. In general, °· Rest as directed by your caregiver. °· Drink enough fluids to keep your urine clear or pale yellow. °· Only take over-the-counter or prescription medicines as directed by your caregiver. Some medicines may help relieve the pain. °· Tell your caregiver about any changes in your pain. °· Follow up with your caregiver as directed. °SEEK IMMEDIATE MEDICAL CARE IF:  °· Your pain is not controlled with medicine.   °· You have new or worsening symptoms. °· Your pain increases.   °· You have abdominal pain.   °· You have shortness of breath.   °· You have persistent nausea or vomiting.   °· You have swelling in your abdomen.   °· You feel faint or pass out.   °· You have blood in your urine. °· You have a fever or persistent symptoms for more than 2-3 days. °· You have a fever and your symptoms suddenly get worse. °MAKE SURE YOU:  °· Understand these instructions. °· Will watch your condition. °· Will get help right away if you are not doing well or get worse. °Document Released: 01/21/2006 Document Revised: 08/24/2012 Document Reviewed: 07/14/2012 °ExitCare® Patient Information ©2015 Exit[employer], LLC. This information is not intended to replace advice given to you by your  health care provider. Make sure you discuss any questions you have with your health care provider. ° °

## 2015-06-14 NOTE — Progress Notes (Signed)
23 y.o.Married white female g0p0  here with complaint of ?UTI, with onset  on 3 weeks ago.Describes pain as a wave that comes and goes,.. Patient denies urinary frequency/urgency/ and pain with urination. Patient denies fever, chills, nausea or back pain. Points to upper mid back of point of pain that can radiate around periodically. Rates pain at greatest as 3. Tried Advil and did have relief, but only used once. Denies bowel changes or indigestion . 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 Recently treated UTI 5/16 and given Macrobid for post coital use. No new personal products. Patient has no pain with  sexual activity. Denies any vaginal symptoms.    Contraception is Nuvaring and is starting menses today..  Patient trying to drink adequate water intake. Periods have been normal no issues.  ROS pertinent to above   O: Healthy female WDWN Affect: Normal, orientation x 3 Skin : warm and dry CVAT: negative bilateral, palpated back and no point of pain identified. Abdomen: negative for suprapubic tenderness, soft, non tender, no rebound tenderness Inguinal lymph nodes not enlarged or tender  Pelvic exam: External genital area: normal, no lesions Bladder,Urethra non tender, Urethral meatus:non tender,no redness Vagina: normal vaginal discharge, normal appearance  Wet prep not done Cervix: normal, non tender, negative CMT Uterus:normal,non tender Adnexa: normal non tender, no fullness or masses   A: Normal pelvic exam Normal abdominal exam ? muscoskeletal R/O UTI 2 + blood( on menses)  P: Reviewed findings of normal pelvic and abdominal exam. Discussed possible muscoskeletal and trial of Advil for 24 to 48 hours with food to see if resolves. Patient agrees this is a good option and will advise if not resolved, may need imaging for further evaluation if indicated. Lab:CBC with diff AVW:UJWJXLab:Urine micro, culture Reviewed warning signs and symptoms of  UTI/abdominal/pelvic pain and need to advise if occurring. Encouraged to limit soda, tea, and coffee and increase water.  RV prn

## 2015-06-15 LAB — CBC WITH DIFFERENTIAL/PLATELET
Basophils Absolute: 0 10*3/uL (ref 0.0–0.1)
Basophils Relative: 1 % (ref 0–1)
EOS ABS: 0 10*3/uL (ref 0.0–0.7)
Eosinophils Relative: 1 % (ref 0–5)
HCT: 42.9 % (ref 36.0–46.0)
Hemoglobin: 14 g/dL (ref 12.0–15.0)
LYMPHS ABS: 1.6 10*3/uL (ref 0.7–4.0)
LYMPHS PCT: 33 % (ref 12–46)
MCH: 29.2 pg (ref 26.0–34.0)
MCHC: 32.6 g/dL (ref 30.0–36.0)
MCV: 89.6 fL (ref 78.0–100.0)
MPV: 10.5 fL (ref 8.6–12.4)
Monocytes Absolute: 0.4 10*3/uL (ref 0.1–1.0)
Monocytes Relative: 9 % (ref 3–12)
Neutro Abs: 2.7 10*3/uL (ref 1.7–7.7)
Neutrophils Relative %: 56 % (ref 43–77)
PLATELETS: 309 10*3/uL (ref 150–400)
RBC: 4.79 MIL/uL (ref 3.87–5.11)
RDW: 13.3 % (ref 11.5–15.5)
WBC: 4.9 10*3/uL (ref 4.0–10.5)

## 2015-06-15 LAB — URINALYSIS, MICROSCOPIC ONLY
Bacteria, UA: NONE SEEN
CASTS: NONE SEEN
CRYSTALS: NONE SEEN
Squamous Epithelial / LPF: NONE SEEN

## 2015-06-16 LAB — URINE CULTURE
Colony Count: NO GROWTH
Organism ID, Bacteria: NO GROWTH

## 2015-06-18 ENCOUNTER — Telehealth: Payer: Self-pay

## 2015-06-18 ENCOUNTER — Encounter: Payer: Self-pay | Admitting: Certified Nurse Midwife

## 2015-06-18 NOTE — Telephone Encounter (Signed)
Left message to call back  

## 2015-06-18 NOTE — Telephone Encounter (Signed)
-----   Message from Verner Choleborah S Leonard, CNM sent at 06/18/2015  8:00 AM EDT ----- Notify patient that urine culture negative, patient status?

## 2015-06-18 NOTE — Progress Notes (Signed)
Reviewed personally.  M. Suzanne Delsa Walder, MD.  

## 2015-06-18 NOTE — Telephone Encounter (Signed)
Returning call.

## 2015-06-18 NOTE — Telephone Encounter (Signed)
lmtcb

## 2015-06-19 NOTE — Telephone Encounter (Signed)
Patient notified of results. See lab 

## 2015-11-26 ENCOUNTER — Other Ambulatory Visit: Payer: Self-pay | Admitting: Certified Nurse Midwife

## 2015-11-26 NOTE — Telephone Encounter (Signed)
Medication refill request: macrobid  Last AEX:  01/24/15 DL Next AEX: 19/14/7812/13/16 DL Last MMG (if hormonal medication request): None Refill authorized: 04/26/15 #30caps/1R. Today please advise.

## 2016-02-17 ENCOUNTER — Other Ambulatory Visit: Payer: Self-pay | Admitting: Certified Nurse Midwife

## 2016-02-17 NOTE — Telephone Encounter (Signed)
Needs AEX 

## 2016-02-17 NOTE — Telephone Encounter (Signed)
Medication refill request: Nuvaring  Last AEX:  01/24/15 DL Next AEX: None Last MMG (if hormonal medication request): None Refill authorized: 01/24/15 #3each/4R. Today #1each/0R?

## 2016-02-18 MED FILL — NUVARING VAGINAL RING: 0.12-0.015 | 28 days supply | Qty: 1 | Fill #0

## 2016-03-05 ENCOUNTER — Ambulatory Visit (INDEPENDENT_AMBULATORY_CARE_PROVIDER_SITE_OTHER): Payer: 59 | Admitting: Certified Nurse Midwife

## 2016-03-05 ENCOUNTER — Encounter: Payer: Self-pay | Admitting: Certified Nurse Midwife

## 2016-03-05 VITALS — BP 112/70 | HR 68 | Resp 16 | Ht 67.75 in | Wt 130.0 lb

## 2016-03-05 DIAGNOSIS — Z01419 Encounter for gynecological examination (general) (routine) without abnormal findings: Secondary | ICD-10-CM

## 2016-03-05 DIAGNOSIS — Z0000 Encounter for general adult medical examination without abnormal findings: Secondary | ICD-10-CM | POA: Diagnosis not present

## 2016-03-05 LAB — POCT URINALYSIS DIPSTICK
Bilirubin, UA: NEGATIVE
Glucose, UA: NEGATIVE
Ketones, UA: NEGATIVE
NITRITE UA: NEGATIVE
PH UA: 5
PROTEIN UA: NEGATIVE
UROBILINOGEN UA: NEGATIVE

## 2016-03-05 NOTE — Progress Notes (Signed)
25 y.o. G0P0 Married  Caucasian Fe here for annual exam.  Periods normal over the past year. Tried for pregnancy this month, stopped Nuvaring midcycle and had withdrawal bleeding. Has now decided not to try for pregnancy until fall. Patient would renewal of Nuvaring. Sees Urgent care if needed. No other health issues. Recent cruise to [employer] !  Patient's last menstrual period was 03/02/2016.          Sexually active: Yes.    The current method of family planning is none Exercising: Yes.    cardio & gym Smoker:  no  Health Maintenance: Pap:  12-29-13 neg MMG:  none Colonoscopy:  none BMD:   none TDaP:  2008 Shingles: no Pneumonia: no Hep C and HIV: not done Labs: poct urine-rbc tr, wbc-tr Self breast exam: pt tries   reports that she has never smoked. She has never used smokeless tobacco. She reports that she drinks about 2.4 oz of alcohol per week. She reports that she does not use illicit drugs.  Past Medical History  Diagnosis Date  . Anxiety     Past Surgical History  Procedure Laterality Date  . Nose set    . Wisdom tooth extraction      Current Outpatient Prescriptions  Medication Sig Dispense Refill  . Phenazopyridine HCl (AZO TABS PO) Take by mouth as needed.    . nitrofurantoin, macrocrystal-monohydrate, (MACROBID) 100 MG capsule TAKE 1 CAPSULE BY MOUTH AFTER COITUS ONLY (Patient not taking: Reported on 03/05/2016) 30 capsule 1   No current facility-administered medications for this visit.    Family History  Problem Relation Age of Onset  . Hypertension Father   . Hyperlipidemia Father   . Hypertension Paternal Grandfather     ROS:  Pertinent items are noted in HPI.  Otherwise, a comprehensive ROS was negative.  Exam:   BP 112/70 mmHg  Pulse 68  Resp 16  Ht 5' 7.75" (1.721 m)  Wt 130 lb (58.968 kg)  BMI 19.91 kg/m2  LMP 03/02/2016 Height: 5' 7.75" (172.1 cm) Ht Readings from Last 3 Encounters:  03/05/16 5' 7.75" (1.721 m)  06/14/15 5' 7.75" (1.721  m)  04/18/15 5' 7.75" (1.721 m)    General appearance: alert, cooperative and appears stated age Head: Normocephalic, without obvious abnormality, atraumatic Neck: no adenopathy, supple, symmetrical, trachea midline and thyroid normal to inspection and palpation Lungs: clear to auscultation bilaterally Breasts: normal appearance, no masses or tenderness, No nipple retraction or dimpling, No nipple discharge or bleeding, No axillary or supraclavicular adenopathy Heart: regular rate and rhythm Abdomen: soft, non-tender; no masses,  no organomegaly Extremities: extremities normal, atraumatic, no cyanosis or edema Skin: Skin color, texture, turgor normal. No rashes or lesions Lymph nodes: Cervical, supraclavicular, and axillary nodes normal. No abnormal inguinal nodes palpated Neurologic: Grossly normal   Pelvic: External genitalia:  no lesions              Urethra:  normal appearing urethra with no masses, tenderness or lesions              Bartholin's and Skene's: normal                 Vagina: normal appearing vagina with normal color and discharge, no lesions              Cervix: no cervical motion tenderness, no lesions and nulliparous appearance              Pap taken: No. Bimanual Exam:  Uterus:  normal size, contour, position, consistency, mobility, non-tender              Adnexa: normal adnexa and no mass, fullness, tenderness               Rectovaginal: Confirms               Anus:  normal   Chaperone present: yes  A:  Well Woman with normal exam  Contraception desires Nuvaring  Planning pregnancy  Rubella status    P:   Reviewed health and wellness pertinent to exam  Rx Nuvaring see order, discussed stopping Nuvaring at normal time when ready to try for pregnancy.  Risks and benefits of Nuvaring reviewed with warning signs.  Discussed Rubella screen, desires today. Discussed concerns with Zika virus and recommendations from CDC if traveled to area of concern. Patient  will check if there were concerns when she was in [place]. Questions addressed. Given pamphlet on planning pregnancy. Questions addressed. Declines scheduling preconceptual consult.  Lab Rubella  Pap smear as above not taken   counseled on breast self exam, adequate intake of calcium and vitamin D, diet and exercise return annually or prn  An After Visit Summary was printed and given to the patient.

## 2016-03-05 NOTE — Patient Instructions (Signed)
General topics  Next pap or exam is  due in 1 year Take a Women's multivitamin Take 1200 mg. of calcium daily - prefer dietary If any concerns in interim to call back  Breast Self-Awareness Practicing breast self-awareness may pick up problems early, prevent significant medical complications, and possibly save your life. By practicing breast self-awareness, you can become familiar with how your breasts look and feel and if your breasts are changing. This allows you to notice changes early. It can also offer you some reassurance that your breast health is good. One way to learn what is normal for your breasts and whether your breasts are changing is to do a breast self-exam. If you find a lump or something that was not present in the past, it is best to contact your caregiver right away. Other findings that should be evaluated by your caregiver include nipple discharge, especially if it is bloody; skin changes or reddening; areas where the skin seems to be pulled in (retracted); or new lumps and bumps. Breast pain is seldom associated with cancer (malignancy), but should also be evaluated by a caregiver. BREAST SELF-EXAM The best time to examine your breasts is 5 7 days after your menstrual period is over.  ExitCare Patient Information 2013 [employer].   Exercise to Stay Healthy Exercise helps you become and stay healthy. EXERCISE IDEAS AND TIPS Choose exercises that:  You enjoy.  Fit into your day. You do not need to exercise really hard to be healthy. You can do exercises at a slow or medium level and stay healthy. You can:  Stretch before and after working out.  Try yoga, Pilates, or tai chi.  Lift weights.  Walk fast, swim, jog, run, climb stairs, bicycle, dance, or rollerskate.  Take aerobic classes. Exercises that burn about 150 calories:  Running 1  miles in 15 minutes.  Playing volleyball for 45 to 60 minutes.  Washing and waxing a car for 45 to 60  minutes.  Playing touch football for 45 minutes.  Walking 1  miles in 35 minutes.  Pushing a stroller 1  miles in 30 minutes.  Playing basketball for 30 minutes.  Raking leaves for 30 minutes.  Bicycling 5 miles in 30 minutes.  Walking 2 miles in 30 minutes.  Dancing for 30 minutes.  Shoveling snow for 15 minutes.  Swimming laps for 20 minutes.  Walking up stairs for 15 minutes.  Bicycling 4 miles in 15 minutes.  Gardening for 30 to 45 minutes.  Jumping rope for 15 minutes.  Washing windows or floors for 45 to 60 minutes. Document Released: 01/02/2011 Document Revised: 02/22/2012 Document Reviewed: 01/02/2011 ExitCare Patient Information 2013 ExitCare[employer].   Other topics ( that may be useful information):    Sexually Transmitted Disease Sexually transmitted disease (STD) refers to any infection that is passed from person to person during sexual activity. This may happen by way of saliva, semen, blood, vaginal mucus, or urine. Common STDs include:  Gonorrhea.  Chlamydia.  Syphilis.  HIV/AIDS.  Genital herpes.  Hepatitis B and C.  Trichomonas.  Human papillomavirus (HPV).  Pubic lice. CAUSES  An STD may be spread by bacteria, virus, or parasite. A person can get an STD by:  Sexual intercourse with an infected person.  Sharing sex toys with an infected person.  Sharing needles with an infected person.  Having intimate contact with the genitals, mouth, or rectal areas of an infected person. SYMPTOMS  Some people may not have any symptoms, but   they can still pass the infection to others. Different STDs have different symptoms. Symptoms include:  Painful or bloody urination.  Pain in the pelvis, abdomen, vagina, anus, throat, or eyes.  Skin rash, itching, irritation, growths, or sores (lesions). These usually occur in the genital or anal area.  Abnormal vaginal discharge.  Penile discharge in men.  Soft, flesh-colored skin growths in the  genital or anal area.  Fever.  Pain or bleeding during sexual intercourse.  Swollen glands in the groin area.  Yellow skin and eyes (jaundice). This is seen with hepatitis. DIAGNOSIS  To make a diagnosis, your caregiver may:  Take a medical history.  Perform a physical exam.  Take a specimen (culture) to be examined.  Examine a sample of discharge under a microscope.  Perform blood test TREATMENT   Chlamydia, gonorrhea, trichomonas, and syphilis can be cured with antibiotic medicine.  Genital herpes, hepatitis, and HIV can be treated, but not cured, with prescribed medicines. The medicines will lessen the symptoms.  Genital warts from HPV can be treated with medicine or by freezing, burning (electrocautery), or surgery. Warts may come back.  HPV is a virus and cannot be cured with medicine or surgery.However, abnormal areas may be followed very closely by your caregiver and may be removed from the cervix, vagina, or vulva through office procedures or surgery. If your diagnosis is confirmed, your recent sexual partners need treatment. This is true even if they are symptom-free or have a negative culture or evaluation. They should not have sex until their caregiver says it is okay. HOME CARE INSTRUCTIONS  All sexual partners should be informed, tested, and treated for all STDs.  Take your antibiotics as directed. Finish them even if you start to feel better.  Only take over-the-counter or prescription medicines for pain, discomfort, or fever as directed by your caregiver.  Rest.  Eat a balanced diet and drink enough fluids to keep your urine clear or pale yellow.  Do not have sex until treatment is completed and you have followed up with your caregiver. STDs should be checked after treatment.  Keep all follow-up appointments, Pap tests, and blood tests as directed by your caregiver.  Only use latex condoms and water-soluble lubricants during sexual activity. Do not use  petroleum jelly or oils.  Avoid alcohol and illegal drugs.  Get vaccinated for HPV and hepatitis. If you have not received these vaccines in the past, talk to your caregiver about whether one or both might be right for you.  Avoid risky sex practices that can break the skin. The only way to avoid getting an STD is to avoid all sexual activity.Latex condoms and dental dams (for oral sex) will help lessen the risk of getting an STD, but will not completely eliminate the risk. SEEK MEDICAL CARE IF:   You have a fever.  You have any new or worsening symptoms. Document Released: 02/20/2003 Document Revised: 02/22/2012 Document Reviewed: 02/27/2011 [facility] Patient Information 2013 [place].    Domestic Abuse You are being battered or abused if someone close to you hits, pushes, or physically hurts you in any way. You also are being abused if you are forced into activities. You are being sexually abused if you are forced to have sexual contact of any kind. You are being emotionally abused if you are made to feel worthless or if you are constantly threatened. It is important to remember that help is available. No one has the right to abuse you. PREVENTION OF FURTHER  ABUSE  Learn the warning signs of danger. This varies with situations but may include: the use of alcohol, threats, isolation from friends and family, or forced sexual contact. Leave if you feel that violence is going to occur.  If you are attacked or beaten, report it to the police so the abuse is documented. You do not have to press charges. The police can protect you while you or the attackers are leaving. Get the officer's name and badge number and a copy of the report.  Find someone you can trust and tell them what is happening to you: your caregiver, a nurse, clergy member, close friend or family member. Feeling ashamed is natural, but remember that you have done nothing wrong. No one deserves abuse. Document Released:  11/27/2000 Document Revised: 02/22/2012 Document Reviewed: 02/05/2011 ExitCare Patient Information 2013 ExitCare[employer].    How Much is Too Much Alcohol? Drinking too much alcohol can cause injury, accidents, and health problems. These types of problems can include:   Car crashes.  Falls.  Family fighting (domestic violence).  Drowning.  Fights.  Injuries.  Burns.  Damage to certain organs.  Having a baby with birth defects. ONE DRINK CAN BE TOO MUCH WHEN YOU ARE:  Working.  Pregnant or breastfeeding.  Taking medicines. Ask your doctor.  Driving or planning to drive. If you or someone you know has a drinking problem, get help from a doctor.  Document Released: 09/26/2009 Document Revised: 02/22/2012 Document Reviewed: 09/26/2009 ExitCare Patient Information 2013 ExitCare, LLC.   Smoking Hazards Smoking cigarettes is extremely bad for your health. Tobacco smoke has over 200 known poisons in it. There are over 60 chemicals in tobacco smoke that cause cancer. Some of the chemicals found in cigarette smoke include:   Cyanide.  Benzene.  Formaldehyde.  Methanol (wood alcohol).  Acetylene (fuel used in welding torches).  Ammonia. Cigarette smoke also contains the poisonous gases nitrogen oxide and carbon monoxide.  Cigarette smokers have an increased risk of many serious medical problems and Smoking causes approximately:  90% of all lung cancer deaths in men.  80% of all lung cancer deaths in women.  90% of deaths from chronic obstructive lung disease. Compared with nonsmokers, smoking increases the risk of:  Coronary heart disease by 2 to 4 times.  Stroke by 2 to 4 times.  Men developing lung cancer by 23 times.  Women developing lung cancer by 13 times.  Dying from chronic obstructive lung diseases by 12 times.  . Smoking is the most preventable cause of death and disease in our society.  WHY IS SMOKING ADDICTIVE?  Nicotine is the chemical  agent in tobacco that is capable of causing addiction or dependence.  When you smoke and inhale, nicotine is absorbed rapidly into the bloodstream through your lungs. Nicotine absorbed through the lungs is capable of creating a powerful addiction. Both inhaled and non-inhaled nicotine may be addictive.  Addiction studies of cigarettes and spit tobacco show that addiction to nicotine occurs mainly during the teen years, when young people begin using tobacco products. WHAT ARE THE BENEFITS OF QUITTING?  There are many health benefits to quitting smoking.   Likelihood of developing cancer and heart disease decreases. Health improvements are seen almost immediately.  Blood pressure, pulse rate, and breathing patterns start returning to normal soon after quitting. QUITTING SMOKING   American Lung Association - 1-800-LUNGUSA  American Cancer Society - 1-800-ACS-2345 Document Released: 01/07/2005 Document Revised: 02/22/2012 Document Reviewed: 09/11/2009 ExitCare Patient Information 2013 ExitCare,   LLC.   Stress Management Stress is a state of physical or mental tension that often results from changes in your life or normal routine. Some common causes of stress are:  Death of a loved one.  Injuries or severe illnesses.  Getting fired or changing jobs.  Moving into a new home. Other causes may be:  Sexual problems.  Business or financial losses.  Taking on a large debt.  Regular conflict with someone at home or at work.  Constant tiredness from lack of sleep. It is not just bad things that are stressful. It may be stressful to:  Win the lottery.  Get married.  Buy a new car. The amount of stress that can be easily tolerated varies from person to person. Changes generally cause stress, regardless of the types of change. Too much stress can affect your health. It may lead to physical or emotional problems. Too little stress (boredom) may also become stressful. SUGGESTIONS TO  REDUCE STRESS:  Talk things over with your family and friends. It often is helpful to share your concerns and worries. If you feel your problem is serious, you may want to get help from a professional counselor.  Consider your problems one at a time instead of lumping them all together. Trying to take care of everything at once may seem impossible. List all the things you need to do and then start with the most important one. Set a goal to accomplish 2 or 3 things each day. If you expect to do too many in a single day you will naturally fail, causing you to feel even more stressed.  Do not use alcohol or drugs to relieve stress. Although you may feel better for a short time, they do not remove the problems that caused the stress. They can also be habit forming.  Exercise regularly - at least 3 times per week. Physical exercise can help to relieve that "uptight" feeling and will relax you.  The shortest distance between despair and hope is often a good night's sleep.  Go to bed and get up on time allowing yourself time for appointments without being rushed.  Take a short "time-out" period from any stressful situation that occurs during the day. Close your eyes and take some deep breaths. Starting with the muscles in your face, tense them, hold it for a few seconds, then relax. Repeat this with the muscles in your neck, shoulders, hand, stomach, back and legs.  Take good care of yourself. Eat a balanced diet and get plenty of rest.  Schedule time for having fun. Take a break from your daily routine to relax. HOME CARE INSTRUCTIONS   Call if you feel overwhelmed by your problems and feel you can no longer manage them on your own.  Return immediately if you feel like hurting yourself or someone else. Document Released: 05/26/2001 Document Revised: 02/22/2012 Document Reviewed: 01/16/2008 [facility] Patient Information 2013 [place].  Pregnancy and Zika Virus Disease Zika virus disease,  or Zika, is an illness that can spread to people from mosquitoes that carry the virus. It may also spread from person to person through infected body fluids. Zika first occurred in [place], but recently it has spread to new areas. The virus occurs in tropical climates. The location of Zika continues to change. Most people who become infected with Zika virus do not develop serious illness. However, Zika may cause birth defects in an unborn baby whose mother is infected with the virus. It may also increase the  risk of miscarriage. WHAT ARE THE SYMPTOMS OF ZIKA VIRUS DISEASE? In many cases, people who have been infected with Zika virus do not develop any symptoms. If symptoms appear, they usually start about a week after the person is infected. Symptoms are usually mild. They may include:  Fever.  Rash.  Red eyes.  Joint pain. HOW DOES ZIKA VIRUS DISEASE SPREAD? The main way that [place] virus spreads is through the bite of a certain type of mosquito. Unlike most types of mosquitos, which bite only at night, the type of mosquito that carries Zika virus bites both at night and during the day. Zika virus can also spread through sexual contact, through a blood transfusion, and from a mother to her baby before or during birth. Once you have had Zika virus disease, it is unlikely that you will get it again. CAN I PASS ZIKA TO MY BABY DURING PREGNANCY? Yes, Zika can pass from a mother to her baby before or during birth. WHAT PROBLEMS CAN ZIKA CAUSE FOR MY BABY? A woman who is infected with Zika virus while pregnant is at risk of having her baby born with a condition in which the brain or head is smaller than expected (microcephaly). Babies who have microcephaly can have developmental delays, seizures, hearing problems, and vision problems. Having Zika virus disease during pregnancy can also increase the risk of miscarriage. HOW CAN ZIKA VIRUS DISEASE BE PREVENTED? There is no vaccine to prevent Zika. The  best way to prevent the disease is to avoid infected mosquitoes and avoid exposure to body fluids that can spread the virus. Avoid any possible exposure to [place] by taking the following precautions. For women and their sex partners:  Avoid traveling to high-risk areas. The locations where [place] is being reported change often. To identify high-risk areas, check the CDC travel website: CreditChaos.com.ee  If you or your sex partner must travel to a high-risk area, talk with a health care provider before and after traveling.  Take all precautions to avoid mosquito bites if you live in, or travel to, any of the high-risk areas. Insect repellents are safe to use during pregnancy.  Ask your health care provider when it is safe to have sexual contact. For women:  If you are pregnant or trying to become pregnant, avoid sexual contact with persons who may have been exposed to [place] virus, persons who have possible symptoms of Zika, or persons whose history you are unsure about. If you choose to have sexual contact with someone who may have been exposed to [place] virus, use condoms correctly during the entire duration of sexual activity, every time. Do not share sexual devices, as you may be exposed to body fluids.  Ask your health care provider about when it is safe to attempt pregnancy after a possible exposure to [place] virus. WHAT STEPS SHOULD I TAKE TO AVOID MOSQUITO BITES? Take these steps to avoid mosquito bites when you are in a high-risk area:  Wear loose clothing that covers your arms and legs.  Limit your outdoor activities.  Do not open windows unless they have window screens.  Sleep under mosquito nets.  Use insect repellent. The best insect repellents have:  DEET, picaridin, oil of lemon eucalyptus (OLE), or [id number] in them.  Higher amounts of an active ingredient in them.  Remember that insect repellents are safe to use during pregnancy.  Do not use OLE on children who are  younger than 44 years of age. Do not use insect repellent on  babies who are younger than 59 months of age.  Cover your child's stroller with mosquito netting. Make sure the netting fits snugly and that any loose netting does not cover your child's mouth or nose. Do not use a blanket as a mosquito-protection cover.  Do not apply insect repellent underneath clothing.  If you are using sunscreen, apply the sunscreen before applying the insect repellent.  Treat clothing with permethrin. Do not apply permethrin directly to your skin. Follow label directions for safe use.  Get rid of standing water, where mosquitoes may reproduce. Standing water is often found in items such as buckets, bowls, animal food dishes, and flowerpots. When you return from traveling to any high-risk area, continue taking actions to protect yourself against mosquito bites for 3 weeks, even if you show no signs of illness. This will prevent spreading Zika virus to uninfected mosquitoes. WHAT SHOULD I KNOW ABOUT THE SEXUAL TRANSMISSION OF ZIKA? People can spread Zika to their sexual partners during vaginal, anal, or oral sex, or by sharing sexual devices. Many people with [place] do not develop symptoms, so a person could spread the disease without knowing that they are infected. The greatest risk is to women who are pregnant or who may become pregnant. Zika virus can live longer in semen than it can live in blood. Couples can prevent sexual transmission of the virus by:  Using condoms correctly during the entire duration of sexual activity, every time. This includes vaginal, anal, and oral sex.  Not sharing sexual devices. Sharing increases your risk of being exposed to body fluid from another person.  Avoiding all sexual activity until your health care provider says it is safe. SHOULD I BE TESTED FOR ZIKA VIRUS? A sample of your blood can be tested for Zika virus. A pregnant woman should be tested if she may have been exposed to  the virus or if she has symptoms of Zika. She may also have additional tests done during her pregnancy, such ultrasound testing. Talk with your health care provider about which tests are recommended.   This information is not intended to replace advice given to you by your health care provider. Make sure you discuss any questions you have with your health care provider.   Document Released: 08/21/2015 Document Reviewed: 08/14/2015 Elsevier Interactive Patient Education [employer].

## 2016-03-06 LAB — RUBELLA SCREEN: RUBELLA: 2.67 {index} — AB (ref <0.90)

## 2016-03-11 NOTE — Progress Notes (Signed)
Encounter reviewed Laura Klontz, MD   

## 2016-03-30 ENCOUNTER — Other Ambulatory Visit: Payer: Self-pay | Admitting: Nurse Practitioner

## 2016-03-30 NOTE — Telephone Encounter (Signed)
Medication refill request: Nuvaring Last AEX:  02/14/2016 with DL  Next AEX: No AEX scheduled for 2018 Last MMG (if hormonal medication request): n/a Refill authorized: #3 rings/3 rfs?  According to note it was written that patient's nuvaring rx was sent in but nothing was ordered. Okay to send in nuvaring rx x 1 year?  [facility] of [facility].

## 2016-03-31 MED FILL — NUVARING VAGINAL RING: 0.12-0.015 | 84 days supply | Qty: 3 | Fill #0

## 2016-07-24 DIAGNOSIS — H52223 Regular astigmatism, bilateral: Secondary | ICD-10-CM | POA: Diagnosis not present

## 2016-07-24 DIAGNOSIS — H5213 Myopia, bilateral: Secondary | ICD-10-CM | POA: Diagnosis not present

## 2016-08-13 ENCOUNTER — Encounter: Payer: Self-pay | Admitting: Obstetrics and Gynecology

## 2016-08-13 ENCOUNTER — Ambulatory Visit (INDEPENDENT_AMBULATORY_CARE_PROVIDER_SITE_OTHER): Payer: 59 | Admitting: Obstetrics and Gynecology

## 2016-08-13 VITALS — BP 100/60 | HR 88 | Resp 20 | Ht 67.75 in | Wt 133.0 lb

## 2016-08-13 DIAGNOSIS — N926 Irregular menstruation, unspecified: Secondary | ICD-10-CM

## 2016-08-13 DIAGNOSIS — Z331 Pregnant state, incidental: Secondary | ICD-10-CM | POA: Diagnosis not present

## 2016-08-13 DIAGNOSIS — Z3490 Encounter for supervision of normal pregnancy, unspecified, unspecified trimester: Secondary | ICD-10-CM

## 2016-08-13 LAB — POCT URINE PREGNANCY: Preg Test, Ur: POSITIVE — AB

## 2016-08-13 NOTE — Patient Instructions (Signed)
First Trimester of Pregnancy The first trimester of pregnancy is from week 1 until the end of week 12 (months 1 through 3). A week after a sperm fertilizes an egg, the egg will implant on the wall of the uterus. This embryo will begin to develop into a baby. Genes from you and your partner are forming the baby. The female genes determine whether the baby is a boy or a girl. At 6-8 weeks, the eyes and face are formed, and the heartbeat can be seen on ultrasound. At the end of 12 weeks, all the baby's organs are formed.  Now that you are pregnant, you will want to do everything you can to have a healthy baby. Two of the most important things are to get good prenatal care and to follow your health care provider's instructions. Prenatal care is all the medical care you receive before the baby's birth. This care will help prevent, find, and treat any problems during the pregnancy and childbirth. BODY CHANGES Your body goes through many changes during pregnancy. The changes vary from woman to woman.   You may gain or lose a couple of pounds at first.  You may feel sick to your stomach (nauseous) and throw up (vomit). If the vomiting is uncontrollable, call your health care provider.  You may tire easily.  You may develop headaches that can be relieved by medicines approved by your health care provider.  You may urinate more often. Painful urination may mean you have a bladder infection.  You may develop heartburn as a result of your pregnancy.  You may develop constipation because certain hormones are causing the muscles that push waste through your intestines to slow down.  You may develop hemorrhoids or swollen, bulging veins (varicose veins).  Your breasts may begin to grow larger and become tender. Your nipples may stick out more, and the tissue that surrounds them (areola) may become darker.  Your gums may bleed and may be sensitive to brushing and flossing.  Dark spots or blotches (chloasma,  mask of pregnancy) may develop on your face. This will likely fade after the baby is born.  Your menstrual periods will stop.  You may have a loss of appetite.  You may develop cravings for certain kinds of food.  You may have changes in your emotions from day to day, such as being excited to be pregnant or being concerned that something may go wrong with the pregnancy and baby.  You may have more vivid and strange dreams.  You may have changes in your hair. These can include thickening of your hair, rapid growth, and changes in texture. Some women also have hair loss during or after pregnancy, or hair that feels dry or thin. Your hair will most likely return to normal after your baby is born. WHAT TO EXPECT AT YOUR PRENATAL VISITS During a routine prenatal visit:  You will be weighed to make sure you and the baby are growing normally.  Your blood pressure will be taken.  Your abdomen will be measured to track your baby's growth.  The fetal heartbeat will be listened to starting around week 10 or 12 of your pregnancy.  Test results from any previous visits will be discussed. Your health care provider may ask you:  How you are feeling.  If you are feeling the baby move.  If you have had any abnormal symptoms, such as leaking fluid, bleeding, severe headaches, or abdominal cramping.  If you are using any tobacco products,   including cigarettes, chewing tobacco, and electronic cigarettes.  If you have any questions. Other tests that may be performed during your first trimester include:  Blood tests to find your blood type and to check for the presence of any previous infections. They will also be used to check for low iron levels (anemia) and Rh antibodies. Later in the pregnancy, blood tests for diabetes will be done along with other tests if problems develop.  Urine tests to check for infections, diabetes, or protein in the urine.  An ultrasound to confirm the proper growth  and development of the baby.  An amniocentesis to check for possible genetic problems.  Fetal screens for spina bifida and Down syndrome.  You may need other tests to make sure you and the baby are doing well.  HIV (human immunodeficiency virus) testing. Routine prenatal testing includes screening for HIV, unless you choose not to have this test. HOME CARE INSTRUCTIONS  Medicines  Follow your health care provider's instructions regarding medicine use. Specific medicines may be either safe or unsafe to take during pregnancy.  Take your prenatal vitamins as directed.  If you develop constipation, try taking a stool softener if your health care provider approves. Diet  Eat regular, well-balanced meals. Choose a variety of foods, such as meat or vegetable-based protein, fish, milk and low-fat dairy products, vegetables, fruits, and whole grain breads and cereals. Your health care provider will help you determine the amount of weight gain that is right for you.  Avoid raw meat and uncooked cheese. These carry germs that can cause birth defects in the baby.  Eating four or five small meals rather than three large meals a day may help relieve nausea and vomiting. If you start to feel nauseous, eating a few soda crackers can be helpful. Drinking liquids between meals instead of during meals also seems to help nausea and vomiting.  If you develop constipation, eat more high-fiber foods, such as fresh vegetables or fruit and whole grains. Drink enough fluids to keep your urine clear or pale yellow. Activity and Exercise  Exercise only as directed by your health care provider. Exercising will help you:  Control your weight.  Stay in shape.  Be prepared for labor and delivery.  Experiencing pain or cramping in the lower abdomen or low back is a good sign that you should stop exercising. Check with your health care provider before continuing normal exercises.  Try to avoid standing for long  periods of time. Move your legs often if you must stand in one place for a long time.  Avoid heavy lifting.  Wear low-heeled shoes, and practice good posture.  You may continue to have sex unless your health care provider directs you otherwise. Relief of Pain or Discomfort  Wear a good support bra for breast tenderness.   Take warm sitz baths to soothe any pain or discomfort caused by hemorrhoids. Use hemorrhoid cream if your health care provider approves.   Rest with your legs elevated if you have leg cramps or low back pain.  If you develop varicose veins in your legs, wear support hose. Elevate your feet for 15 minutes, 3-4 times a day. Limit salt in your diet. Prenatal Care  Schedule your prenatal visits by the twelfth week of pregnancy. They are usually scheduled monthly at first, then more often in the last 2 months before delivery.  Write down your questions. Take them to your prenatal visits.  Keep all your prenatal visits as directed by your   health care provider. Safety  Wear your seat belt at all times when driving.  Make a list of emergency phone numbers, including numbers for family, friends, the hospital, and police and fire departments. General Tips  Ask your health care provider for a referral to a local prenatal education class. Begin classes no later than at the beginning of month 6 of your pregnancy.  Ask for help if you have counseling or nutritional needs during pregnancy. Your health care provider can offer advice or refer you to specialists for help with various needs.  Do not use hot tubs, steam rooms, or saunas.  Do not douche or use tampons or scented sanitary pads.  Do not cross your legs for long periods of time.  Avoid cat litter boxes and soil used by cats. These carry germs that can cause birth defects in the baby and possibly loss of the fetus by miscarriage or stillbirth.  Avoid all smoking, herbs, alcohol, and medicines not prescribed by  your health care provider. Chemicals in these affect the formation and growth of the baby.  Do not use any tobacco products, including cigarettes, chewing tobacco, and electronic cigarettes. If you need help quitting, ask your health care provider. You may receive counseling support and other resources to help you quit.  Schedule a dentist appointment. At home, brush your teeth with a soft toothbrush and be gentle when you floss. SEEK MEDICAL CARE IF:   You have dizziness.  You have mild pelvic cramps, pelvic pressure, or nagging pain in the abdominal area.  You have persistent nausea, vomiting, or diarrhea.  You have a bad smelling vaginal discharge.  You have pain with urination.  You notice increased swelling in your face, hands, legs, or ankles. SEEK IMMEDIATE MEDICAL CARE IF:   You have a fever.  You are leaking fluid from your vagina.  You have spotting or bleeding from your vagina.  You have severe abdominal cramping or pain.  You have rapid weight gain or loss.  You vomit blood or material that looks like coffee grounds.  You are exposed to German measles and have never had them.  You are exposed to fifth disease or chickenpox.  You develop a severe headache.  You have shortness of breath.  You have any kind of trauma, such as from a fall or a car accident.   This information is not intended to replace advice given to you by your health care provider. Make sure you discuss any questions you have with your health care provider.   Document Released: 11/24/2001 Document Revised: 12/21/2014 Document Reviewed: 10/10/2013 Elsevier Interactive Patient Education 2016 Elsevier Inc.  

## 2016-08-13 NOTE — Progress Notes (Signed)
GYNECOLOGY  VISIT   HPI: 25 y.o.   Married  Caucasian  female   G0P0 with Patient's last menstrual period was 07/08/2016.   here for Confirm Pregnancy. LMP of 7/26 would make her 5+[redacted] weeks pregnant with an EDC of 04/14/17. She just stopped her nuvaring in 6/17. Had 2 cycles off the ring, 28-30 days. No h/o infertility. No spotting, some mild intermittent cramping, no bad. Nipples are sore. No h/o STD's. No abdominal surgery. She is on PNV.  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  GYNECOLOGIC HISTORY: Patient's last menstrual period was 07/08/2016. Contraception: N/A Menopausal hormone therapy: N/A        OB History    Gravida Para Term Preterm AB Living   0             SAB TAB Ectopic Multiple Live Births                     There are no active problems to display for this patient.   Past Medical History:  Diagnosis Date  . Anxiety     Past Surgical History:  Procedure Laterality Date  . nose set    . WISDOM TOOTH EXTRACTION      Current Outpatient Prescriptions  Medication Sig Dispense Refill  . Prenatal Vit-Fe Fumarate-FA (PRENATAL MULTIVITAMIN) TABS tablet Take 1 tablet by mouth daily at 12 noon.     No current facility-administered medications for this visit.      ALLERGIES: Review of patient's allergies indicates no known allergies.  Family History  Problem Relation Age of Onset  . Hypertension Father   . Hyperlipidemia Father   . Hypertension Paternal Grandfather     Social History   Social History  . Marital status: Married    Spouse name: N/A  . Number of children: N/A  . Years of education: N/A   Occupational History  . Not on file.   Social History Main Topics  . Smoking status: Never Smoker  . Smokeless tobacco: Never Used  . Alcohol use 2.4 oz/week    4 Standard drinks or equivalent per week  . Drug use: No  . Sexual activity: Yes    Partners: Male    Birth control/ protection:    Other Topics Concern  . Not on file   Social History  Narrative  . No narrative on file    Review of Systems  Constitutional: Negative.   HENT: Negative.   Eyes: Negative.   Respiratory: Negative.   Cardiovascular: Negative.   Gastrointestinal: Negative.   Genitourinary: Negative.   Musculoskeletal: Negative.   Skin: Negative.   Neurological: Negative.   Endo/Heme/Allergies: Negative.   Psychiatric/Behavioral: Negative.     PHYSICAL EXAMINATION:    BP 100/60 (BP Location: Right Arm, Patient Position: Sitting, Cuff Size: Normal)   Pulse 88   Resp 20   Ht 5' 7.75" (1.721 m)   Wt 133 lb (60.3 kg)   LMP 07/08/2016   BMI 20.37 kg/m     General appearance: alert, cooperative and appears stated age  ASSESSMENT Pregnant, approximately 5 weeks by LMP    PLAN Discussed starting OB care Avoid medication other than tylenol Continue PNV Avoid ETOH, hot tubs, sauna, unpasteurized foods, discussed fish intake Questions answered, information given   An After Visit Summary was printed and given to the patient.  15 minutes face to face time of which over 50% was spent in counseling.

## 2016-08-26 ENCOUNTER — Telehealth: Payer: Self-pay | Admitting: Certified Nurse Midwife

## 2016-08-26 NOTE — Telephone Encounter (Signed)
Patient called and requested an Rx for a "uti." She said she has had these frequently in the past. She declined an appointment due to going into work this afternoon.  Pharmacy on file is correct, if needed.

## 2016-08-26 NOTE — Telephone Encounter (Signed)
Spoke with patient. Patient reports symptoms of a UTI that started today. Reports urinary frequency and urgency. Denies any lower back pain, fever ,or chills. Patient is requesting rx be sent in to her pharmacy on file. Advised patient she will need to be seen in the office for evaluation before antibiotic can be prescribed. She is agreeable and verbalizes understanding. Patient is almost [redacted] weeks pregnant and is working on scheduling her first OB appointment, but has not yet seen an OB provider. Patient declines OV today due to work schedule Appointment scheduled for 08/27/2016 at 12:45 pm with Leota Sauerseborah Leonard CNM. Patient is agreeable to date and time. Advised patient if her symptoms worsen or she develops lower back pain, fever, or chills she will need to be seen for evaluation at a local urgent care or ER over night. She is agreeable and verbalizes understanding.  Routing to provider for final review. Patient agreeable to disposition. Will close encounter.

## 2016-08-27 ENCOUNTER — Encounter: Payer: Self-pay | Admitting: Certified Nurse Midwife

## 2016-08-27 ENCOUNTER — Ambulatory Visit (INDEPENDENT_AMBULATORY_CARE_PROVIDER_SITE_OTHER): Payer: 59 | Admitting: Certified Nurse Midwife

## 2016-08-27 VITALS — BP 118/70 | HR 72 | Temp 98.2°F | Resp 16 | Ht 67.75 in | Wt 134.0 lb

## 2016-08-27 DIAGNOSIS — N912 Amenorrhea, unspecified: Secondary | ICD-10-CM | POA: Diagnosis not present

## 2016-08-27 DIAGNOSIS — N390 Urinary tract infection, site not specified: Secondary | ICD-10-CM | POA: Diagnosis not present

## 2016-08-27 DIAGNOSIS — Z3201 Encounter for pregnancy test, result positive: Secondary | ICD-10-CM

## 2016-08-27 LAB — POCT URINALYSIS DIPSTICK
BILIRUBIN UA: NEGATIVE
GLUCOSE UA: NEGATIVE
KETONES UA: NEGATIVE
Leukocytes, UA: NEGATIVE
Nitrite, UA: POSITIVE
Protein, UA: NEGATIVE
RBC UA: NEGATIVE
Urobilinogen, UA: NEGATIVE
pH, UA: 5

## 2016-08-27 MED ORDER — NITROFURANTOIN MONOHYD MACRO 100 MG PO CAPS: 100.0000 mg | ORAL_CAPSULE | Freq: Two times a day (BID) | ORAL | 0 refills | Status: DC

## 2016-08-27 MED FILL — NITROFURANTOIN MONO-MCR 100: 100 | 7 days supply | Qty: 14 | Fill #0

## 2016-08-27 NOTE — Progress Notes (Signed)
25 y.o. [name] G0P0 here with complaint of UTI, with onset  on 6 days ago.. Patient complaining of urinary frequency/urgency/ and pain with urination. Some burning with urination. Patient denies fever, chills, nausea or back pain. No new personal products. Patient feels related to sexual activity. Has history of post coital UTI and had stopped Macrobid for after coitus. Denies any vaginal symptoms.  Patient is 7 +[redacted] weeks gestation. Has first OB appointment tomorrow. Some nausea, no vomiting with pregnancy. Eating small frequent meals which is helping. No other health issues today.   O: Healthy female WDWN Affect: Normal, orientation x 3 Skin : warm and dry CVAT: negative bilateral Abdomen: positive for suprapubic tenderness  Pelvic exam: External genital area: normal, no lesions Bladder,Urethra tender, Urethral meatus: tender,no edness Vagina: normal vaginal discharge, normal appearance   Cervix: normal, non tender Uterus:normal,non tender, slightly enlarged Adnexa: normal non tender, no fullness or masses   A: UTI Normal pelvic exam Poct urine-nitrite positive Pregnancy at 7 +1 weeks with OB appt. Tomorrow  P: Reviewed findings of UTI and need for treatment and importance of treatment in pregnancy.  WU:[id number]:Macrobid see order with instructions WGN:[id number]:Urine , culture Reviewed warning signs and symptoms of UTI and need to advise if occurring. Encouraged to limit soda, tea, and coffee and be sure to increase water intake. Try Natal pops for nausea of pregnancy.   RV prn

## 2016-08-27 NOTE — Patient Instructions (Signed)
Pregnancy and Urinary Tract Infection  A urinary tract infection (UTI) is a bacterial infection of the urinary tract. Infection of the urinary tract can include the ureters, kidneys (pyelonephritis), bladder (cystitis), and urethra (urethritis). All pregnant women should be screened for bacteria in the urinary tract. Identifying and treating a UTI will decrease the risk of preterm labor and developing more serious infections in both the mother and baby.  CAUSES  Bacteria germs cause almost all UTIs.   RISK FACTORS  Many factors can increase your chances of getting a UTI during pregnancy. These include:  · Having a short urethra.  · Poor toilet and hygiene habits.  · Sexual intercourse.  · Blockage of urine along the urinary tract.  · Problems with the pelvic muscles or nerves.  · Diabetes.  · Obesity.  · Bladder problems after having several children.  · Previous history of UTI.  SIGNS AND SYMPTOMS   · Pain, burning, or a stinging feeling when urinating.  · Suddenly feeling the need to urinate right away (urgency).  · Loss of bladder control (urinary incontinence).  · Frequent urination, more than is common with pregnancy.  · Lower abdominal or back discomfort.  · Cloudy urine.  · Blood in the urine (hematuria).  · Fever.   When the kidneys are infected, the symptoms may be:  · Back pain.  · Flank pain on the right side more so than the left.  · Fever.  · Chills.  · Nausea.  · Vomiting.  DIAGNOSIS   A urinary tract infection is usually diagnosed through urine tests. Additional tests and procedures are sometimes done. These may include:  · Ultrasound exam of the kidneys, ureters, bladder, and urethra.  · Looking in the bladder with a lighted tube (cystoscopy).  TREATMENT  Typically, UTIs can be treated with antibiotic medicines.   HOME CARE INSTRUCTIONS   · Only take over-the-counter or prescription medicines as directed by your health care provider. If you were prescribed antibiotics, take them as directed. Finish  them even if you start to feel better.  · Drink enough fluids to keep your urine clear or pale yellow.  · Do not have sexual intercourse until the infection is gone and your health care provider says it is okay.  · Make sure you are tested for UTIs throughout your pregnancy. These infections often come back.   Preventing a UTI in the Future  · Practice good toilet habits. Always wipe from front to back. Use the tissue only once.  · Do not hold your urine. Empty your bladder as soon as possible when the urge comes.  · Do not douche or use deodorant sprays.  · Wash with soap and warm water around the genital area and the anus.  · Empty your bladder before and after sexual intercourse.  · Wear underwear with a cotton crotch.  · Avoid caffeine and carbonated drinks. They can irritate the bladder.  · Drink cranberry juice or take cranberry pills. This may decrease the risk of getting a UTI.  · Do not drink alcohol.  · Keep all your appointments and tests as scheduled.   SEEK MEDICAL CARE IF:   · Your symptoms get worse.  · You are still having fevers 2 or more days after treatment begins.  · You have a rash.  · You feel that you are having problems with medicines prescribed.  · You have abnormal vaginal discharge.  SEEK IMMEDIATE MEDICAL CARE IF:   · You have back or flank   pain.  · You have chills.  · You have blood in your urine.  · You have nausea and vomiting.  · You have contractions of your uterus.  · You have a gush of fluid from the vagina.  MAKE SURE YOU:  · Understand these instructions.    · Will watch your condition.    · Will get help right away if you are not doing well or get worse.       This information is not intended to replace advice given to you by your health care provider. Make sure you discuss any questions you have with your health care provider.     Document Released: 03/27/2011 Document Revised: 09/20/2013 Document Reviewed: 06/29/2013  Elsevier Interactive Patient Education ©2016 Elsevier  Inc.

## 2016-08-28 DIAGNOSIS — Z3201 Encounter for pregnancy test, result positive: Secondary | ICD-10-CM | POA: Diagnosis not present

## 2016-08-28 DIAGNOSIS — R112 Nausea with vomiting, unspecified: Secondary | ICD-10-CM | POA: Diagnosis not present

## 2016-08-28 DIAGNOSIS — N911 Secondary amenorrhea: Secondary | ICD-10-CM | POA: Diagnosis not present

## 2016-08-29 LAB — URINE CULTURE

## 2016-08-30 NOTE — Progress Notes (Signed)
Encounter reviewed Forever Arechiga, MD   

## 2016-09-10 DIAGNOSIS — Z3A09 9 weeks gestation of pregnancy: Secondary | ICD-10-CM | POA: Diagnosis not present

## 2016-09-10 DIAGNOSIS — O26891 Other specified pregnancy related conditions, first trimester: Secondary | ICD-10-CM | POA: Diagnosis not present

## 2016-09-11 DIAGNOSIS — Z3A09 9 weeks gestation of pregnancy: Secondary | ICD-10-CM | POA: Diagnosis not present

## 2016-09-11 DIAGNOSIS — Z36 Encounter for antenatal screening of mother: Secondary | ICD-10-CM | POA: Diagnosis not present

## 2016-09-11 DIAGNOSIS — Z3401 Encounter for supervision of normal first pregnancy, first trimester: Secondary | ICD-10-CM | POA: Diagnosis not present

## 2016-09-11 LAB — OB RESULTS CONSOLE ABO/RH: RH TYPE: NEGATIVE

## 2016-09-11 LAB — OB RESULTS CONSOLE ANTIBODY SCREEN: Antibody Screen: NEGATIVE

## 2016-09-11 LAB — OB RESULTS CONSOLE RPR: RPR: NONREACTIVE

## 2016-09-11 LAB — OB RESULTS CONSOLE GC/CHLAMYDIA
CHLAMYDIA, DNA PROBE: NEGATIVE
Gonorrhea: NEGATIVE

## 2016-09-11 LAB — OB RESULTS CONSOLE HEPATITIS B SURFACE ANTIGEN: Hepatitis B Surface Ag: NEGATIVE

## 2016-09-11 LAB — OB RESULTS CONSOLE HIV ANTIBODY (ROUTINE TESTING): HIV: NONREACTIVE

## 2016-09-11 LAB — OB RESULTS CONSOLE RUBELLA ANTIBODY, IGM: Rubella: IMMUNE

## 2016-10-01 DIAGNOSIS — Z369 Encounter for antenatal screening, unspecified: Secondary | ICD-10-CM | POA: Diagnosis not present

## 2016-10-01 DIAGNOSIS — Z3682 Encounter for antenatal screening for nuchal translucency: Secondary | ICD-10-CM | POA: Diagnosis not present

## 2016-10-01 DIAGNOSIS — Z3A12 12 weeks gestation of pregnancy: Secondary | ICD-10-CM | POA: Diagnosis not present

## 2016-10-16 DIAGNOSIS — H168 Other keratitis: Secondary | ICD-10-CM | POA: Diagnosis not present

## 2016-11-03 DIAGNOSIS — Z36 Encounter for antenatal screening for chromosomal anomalies: Secondary | ICD-10-CM | POA: Diagnosis not present

## 2016-11-20 DIAGNOSIS — Z363 Encounter for antenatal screening for malformations: Secondary | ICD-10-CM | POA: Diagnosis not present

## 2016-11-21 ENCOUNTER — Emergency Department
Admission: EM | Admit: 2016-11-21 | Discharge: 2016-11-21 | Disposition: A | Discharge status: Home / Self Care | Payer: 59 | Source: Home / Self Care | Attending: Emergency Medicine | Admitting: Emergency Medicine

## 2016-11-21 ENCOUNTER — Encounter: Payer: Self-pay | Admitting: Emergency Medicine

## 2016-11-21 DIAGNOSIS — R109 Unspecified abdominal pain: Secondary | ICD-10-CM | POA: Diagnosis not present

## 2016-11-21 DIAGNOSIS — R319 Hematuria, unspecified: Secondary | ICD-10-CM

## 2016-11-21 LAB — POCT URINALYSIS DIP (MANUAL ENTRY)
Bilirubin, UA: NEGATIVE
Glucose, UA: NEGATIVE
Ketones, POC UA: NEGATIVE
Leukocytes, UA: NEGATIVE
Nitrite, UA: NEGATIVE
Protein Ur, POC: NEGATIVE
Spec Grav, UA: 1.02
Urobilinogen, UA: 0.2
pH, UA: 6.5

## 2016-11-21 MED ORDER — NITROFURANTOIN MONOHYD MACRO 100 MG PO CAPS: 100.0000 mg | ORAL_CAPSULE | Freq: Two times a day (BID) | ORAL | 0 refills | Status: DC

## 2016-11-21 NOTE — ED Triage Notes (Signed)
Pt c/o right sided low back pain, would like to rule out a UTI.  No injury to back, no dysuria, no blood in urine.

## 2016-11-21 NOTE — ED Provider Notes (Addendum)
[facility]    CSN: [id number] Arrival date & time: 11/21/16  1449     History   Chief Complaint Chief Complaint  Patient presents with  . Back Pain    HPI Laura Hall is a 25 y.o. female.   HPI Here with husband. Complains of 2 weeks of intermittent right-sided flank pain, dull, not associated with movement. No radiation. Currently, the intensity is 1 out of 10. She took Tylenol earlier today which seemed to help. The maximum pain was 5 out of 10. She feels as if this could be UTI, with occasional urinary frequency and darker urine at times, but denies dysuria, urgency, abdominal pain, fever, chills, nausea, vomiting, pelvic pain, or vaginal bleeding. She is 19-1/[redacted] weeks pregnant, she states had normal OB checkup yesterday with normal obstetric ultrasound. She has a history of recurrent UTIs , and Macrobid has worked well in the past without side effects. Has never seen a urologist. Denies history of kidney stones. 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 Past Medical History:  Diagnosis Date  . Anxiety    19-1/[redacted] weeks pregnant.  Past Surgical History:  Procedure Laterality Date  . nose set    . WISDOM TOOTH EXTRACTION      OB History    Gravida Para Term Preterm AB Living   1             SAB TAB Ectopic Multiple Live Births                   Home Medications    Prior to Admission medications   Medication Sig Start Date End Date Taking? Authorizing Provider  ACETAMINOPHEN PO Take by mouth.   Yes Historical Provider, MD  nitrofurantoin, macrocrystal-monohydrate, (MACROBID) 100 MG capsule Take 1 capsule (100 mg total) by mouth 2 (two) times daily. 11/21/16   Lajean Manesavid Tavarus Poteete, MD  Prenatal Vit-Fe Fumarate-FA (PRENATAL MULTIVITAMIN) TABS tablet Take 1 tablet by mouth daily at 12 noon.    Historical Provider, MD    Family History Family History  Problem Relation Age of Onset  . Hypertension Father   . Hyperlipidemia Father   .  Hypertension Paternal Grandfather   After further questioning, she states mother has history of kidney stones  Social History Social History  Substance Use Topics  . Smoking status: Never Smoker  . Smokeless tobacco: Never Used  . Alcohol use No     Allergies   Patient has no known allergies.   Review of Systems Review of Systems  All other systems reviewed and are negative.    Physical Exam Triage Vital Signs ED Triage Vitals  Enc Vitals Group     BP 11/21/16 1539 135/78     Pulse Rate 11/21/16 1539 79     Resp --      Temp 11/21/16 1539 98.2 F (36.8 C)     Temp src --      SpO2 11/21/16 1539 100 %     Weight 11/21/16 1539 142 lb (64.4 kg)     Height 11/21/16 1539 5\' 8"  (1.727 m)     Head Circumference --      Peak Flow --      Pain Score 11/21/16 1541 4     Pain Loc --      Pain Edu? --      Excl. in GC? --    No data found.   Updated Vital Signs BP 135/78 (BP Location: Left Arm)  Pulse 79   Temp 98.2 F (36.8 C)   Ht 5\' 8"  (1.727 m)   Wt 142 lb (64.4 kg)   LMP 07/08/2016   SpO2 100%   BMI 21.59 kg/m   Visual Acuity Right Eye Distance:   Left Eye Distance:   Bilateral Distance:    Right Eye Near:   Left Eye Near:    Bilateral Near:     Physical Exam  Constitutional: She is oriented to person, place, and time. She appears well-developed and well-nourished. No distress.  HENT:  Head: Normocephalic and atraumatic.  Mouth/Throat: Oropharynx is clear and moist and mucous membranes are normal.  Eyes: Pupils are equal, round, and reactive to light. No scleral icterus.  Neck: Normal range of motion. Neck supple.  Cardiovascular: Normal rate, regular rhythm and normal heart sounds.   Pulmonary/Chest: Effort normal and breath sounds normal.  Abdominal: Soft. She exhibits no distension and no mass. There is no hepatosplenomegaly. There is no tenderness. There is no rebound, no guarding and no CVA tenderness.  Musculoskeletal:  Back nontender.    Lymphadenopathy:    She has no cervical adenopathy.  Neurological: She is alert and oriented to person, place, and time. No cranial nerve deficit.  Skin: Skin is warm and dry. No rash noted.  Psychiatric: She has a normal mood and affect. Her behavior is normal.  Nursing note and vitals reviewed.    UC Treatments / Results  Labs (all labs ordered are listed, but only abnormal results are displayed) Labs Reviewed  POCT URINALYSIS DIP (MANUAL ENTRY) - Abnormal; Notable for the following:       Result Value   Blood, UA moderate (*)    All other components within normal limits  URINE CULTURE    EKG  EKG Interpretation None       Radiology No results found.  Procedures Procedures (including critical care time)  Medications Ordered in UC Medications - No data to display   Initial Impression / Assessment and Plan / UC Course  I have reviewed the triage vital signs and the nursing notes.  Pertinent labs & imaging results that were available during my care of the patient were reviewed by me and considered in my medical decision making (see chart for details).  Clinical Course    Final Clinical Impressions(s) / UC Diagnoses   Final diagnoses:  Acute right flank pain  Hematuria, unspecified type   I strongly suspect right renal colic, but currently pain is 1 out of 10. Although history of recurrent UTIs, she does not have all of the findings to definitely diagnosed UTI, but it's possible that she has a low-grade UTI. Urinalysis shows moderate blood but otherwise negative.  Risks, benefits, alternatives discussed at length with patient and husband. She is almost [redacted] weeks pregnant with normal ultrasound by history yesterday. I researched, Macrobid approved in second trimester. However, other theoretical risks discussed and patient and husband request treatment with Macrobid.  They agree with the following plans:  New Prescriptions Discharge Medication List as of  11/21/2016  4:54 PM     nitrofurantoin, macrocrystal-monohydrate, (MACROBID) 100 MG capsule Take 1 capsule (100 mg total) by mouth 2 (two) times daily. 10 capsule  Urine culture sent. Push fluids Tylenol as needed for pain. Gave her a urine strainer with instructions. As she is not currently in pain, she declined the option of going to ER, but I advised and her special situation of possible right kidney stone and [redacted] weeks pregnant, that  if she were to have severe pain or other red flags, to go to ER stat be seen by EDP who can call in urologist on-call. Follow-up with OB within 1 week Precautions discussed. Red flags discussed. Questions invited and answered. Patient and husband voiced understanding and agreement with above plans.    Lajean Manesavid Nazario Russom, MD 11/21/16 Ninfa [id number]    Lajean Manesavid Eliani Leclere, MD 11/21/16 [phone]

## 2016-11-23 ENCOUNTER — Telehealth: Payer: Self-pay | Admitting: *Deleted

## 2016-11-23 NOTE — Telephone Encounter (Signed)
Callback: No answer, LMOM f/u from visit today per Dr. Rosanne AshingMassey's request. We will call back once [facility] comes back.

## 2016-11-25 LAB — URINE CULTURE: Organism ID, Bacteria: NO GROWTH

## 2016-12-14 NOTE — L&D Delivery Note (Signed)
Delivery Note Pt pushed very well for 28 minutes for delivery.  At 12:55 PM a viable and healthy female was delivered via Vaginal, Spontaneous Delivery (Presentation: OA; LOT  ).  APGAR: 9, 9; weight  P.   Placenta status: delivered, intact .  Cord: 3V with the following complications: nuchal x 2.    Anesthesia:  none Episiotomy: None Lacerations: 2nd degree, B labial, L hemostatic Suture Repair: 3.0 vicryl rapide Est. Blood Loss (mL):  200cc  Mom to postpartum.  Baby to Couplet care / Skin to Skin.  Laura Hall 04/01/2017, 1:36 PM  Br/ Contra?/Tdap in PNC/RI/O neg

## 2016-12-23 DIAGNOSIS — Z3A24 24 weeks gestation of pregnancy: Secondary | ICD-10-CM | POA: Diagnosis not present

## 2016-12-23 DIAGNOSIS — O358XX1 Maternal care for other (suspected) fetal abnormality and damage, fetus 1: Secondary | ICD-10-CM | POA: Diagnosis not present

## 2017-01-13 DIAGNOSIS — Z3689 Encounter for other specified antenatal screening: Secondary | ICD-10-CM | POA: Diagnosis not present

## 2017-01-15 ENCOUNTER — Encounter (HOSPITAL_COMMUNITY): Payer: Self-pay

## 2017-01-15 ENCOUNTER — Inpatient Hospital Stay (HOSPITAL_COMMUNITY)
Admission: AD | Admit: 2017-01-15 | Discharge: 2017-01-15 | Disposition: A | Discharge status: Home / Self Care | Payer: 59 | Source: Ambulatory Visit | Attending: Obstetrics and Gynecology | Admitting: Obstetrics and Gynecology

## 2017-01-15 ENCOUNTER — Inpatient Hospital Stay (HOSPITAL_COMMUNITY): Payer: 59

## 2017-01-15 DIAGNOSIS — O2342 Unspecified infection of urinary tract in pregnancy, second trimester: Secondary | ICD-10-CM | POA: Diagnosis not present

## 2017-01-15 DIAGNOSIS — Z3A27 27 weeks gestation of pregnancy: Secondary | ICD-10-CM | POA: Diagnosis not present

## 2017-01-15 DIAGNOSIS — R319 Hematuria, unspecified: Secondary | ICD-10-CM

## 2017-01-15 DIAGNOSIS — N3001 Acute cystitis with hematuria: Secondary | ICD-10-CM

## 2017-01-15 DIAGNOSIS — N1330 Unspecified hydronephrosis: Secondary | ICD-10-CM | POA: Diagnosis not present

## 2017-01-15 DIAGNOSIS — O9989 Other specified diseases and conditions complicating pregnancy, childbirth and the puerperium: Secondary | ICD-10-CM | POA: Diagnosis not present

## 2017-01-15 DIAGNOSIS — R300 Dysuria: Secondary | ICD-10-CM | POA: Diagnosis present

## 2017-01-15 LAB — COMPREHENSIVE METABOLIC PANEL
ALK PHOS: 110 U/L (ref 38–126)
ALT: 16 U/L (ref 14–54)
ANION GAP: 9 (ref 5–15)
AST: 30 U/L (ref 15–41)
Albumin: 3.2 g/dL — ABNORMAL LOW (ref 3.5–5.0)
BILIRUBIN TOTAL: 0.5 mg/dL (ref 0.3–1.2)
BUN: 8 mg/dL (ref 6–20)
CALCIUM: 8.5 mg/dL — AB (ref 8.9–10.3)
CO2: 22 mmol/L (ref 22–32)
CREATININE: 0.57 mg/dL (ref 0.44–1.00)
Chloride: 104 mmol/L (ref 101–111)
Glucose, Bld: 96 mg/dL (ref 65–99)
Potassium: 4.5 mmol/L (ref 3.5–5.1)
SODIUM: 135 mmol/L (ref 135–145)
TOTAL PROTEIN: 6.7 g/dL (ref 6.5–8.1)

## 2017-01-15 LAB — CBC WITH DIFFERENTIAL/PLATELET
Basophils Absolute: 0 10*3/uL (ref 0.0–0.1)
Basophils Relative: 0 %
Eosinophils Absolute: 0 10*3/uL (ref 0.0–0.7)
Eosinophils Relative: 0 %
HCT: 36.5 % (ref 36.0–46.0)
HEMOGLOBIN: 12.5 g/dL (ref 12.0–15.0)
LYMPHS ABS: 1.3 10*3/uL (ref 0.7–4.0)
LYMPHS PCT: 7 %
MCH: 29.8 pg (ref 26.0–34.0)
MCHC: 34.2 g/dL (ref 30.0–36.0)
MCV: 86.9 fL (ref 78.0–100.0)
MONOS PCT: 4 %
Monocytes Absolute: 0.7 10*3/uL (ref 0.1–1.0)
NEUTROS PCT: 89 %
Neutro Abs: 15 10*3/uL — ABNORMAL HIGH (ref 1.7–7.7)
Platelets: 293 10*3/uL (ref 150–400)
RBC: 4.2 MIL/uL (ref 3.87–5.11)
RDW: 12.8 % (ref 11.5–15.5)
WBC: 17 10*3/uL — AB (ref 4.0–10.5)

## 2017-01-15 LAB — URINALYSIS, ROUTINE W REFLEX MICROSCOPIC
Bilirubin Urine: NEGATIVE
GLUCOSE, UA: NEGATIVE mg/dL
KETONES UR: NEGATIVE mg/dL
Nitrite: POSITIVE — AB
PROTEIN: 30 mg/dL — AB
Specific Gravity, Urine: 1.017 (ref 1.005–1.030)
pH: 5 (ref 5.0–8.0)

## 2017-01-15 MED ORDER — ONDANSETRON HCL 4 MG/2ML IJ SOLN
4.0000 mg | Freq: Once | INTRAMUSCULAR | Status: AC
  Administered 2017-01-15: 4 mg via INTRAVENOUS
  Filled 2017-01-15: qty 2

## 2017-01-15 MED ORDER — SODIUM CHLORIDE 0.9 % IV SOLN
INTRAVENOUS | Status: DC
  Administered 2017-01-15: 999 mL/h via INTRAVENOUS

## 2017-01-15 MED ORDER — LACTATED RINGERS IV BOLUS (SEPSIS): 1000.0000 mL | Freq: Once | INTRAVENOUS | Status: DC

## 2017-01-15 MED ORDER — CEPHALEXIN 500 MG PO CAPS: 500.0000 mg | ORAL_CAPSULE | Freq: Three times a day (TID) | ORAL | 0 refills | Status: DC

## 2017-01-15 MED ORDER — TAMSULOSIN HCL 0.4 MG PO CAPS: 0.4000 mg | ORAL_CAPSULE | Freq: Every day | ORAL | 0 refills | Status: DC

## 2017-01-15 MED ORDER — HYDROMORPHONE HCL 1 MG/ML IJ SOLN
0.5000 mg | Freq: Once | INTRAMUSCULAR | Status: AC
  Administered 2017-01-15: 0.5 mg via INTRAVENOUS
  Filled 2017-01-15: qty 1

## 2017-01-15 MED ORDER — HYDROMORPHONE HCL 1 MG/ML IJ SOLN
1.0000 mg | Freq: Once | INTRAMUSCULAR | Status: AC
Start: 2017-01-15 — End: 2017-01-15
  Administered 2017-01-15: 1 mg via INTRAVENOUS
  Filled 2017-01-15: qty 1

## 2017-01-15 MED ORDER — CEFTRIAXONE SODIUM 2 G IJ SOLR
2.0000 g | Freq: Once | INTRAMUSCULAR | Status: AC
Start: 2017-01-15 — End: 2017-01-15
  Administered 2017-01-15: 2 g via INTRAVENOUS
  Filled 2017-01-15: qty 2

## 2017-01-15 MED FILL — CEPHALEXIN 500 MG CAPSULE: 500 | 7 days supply | Qty: 21 | Fill #0

## 2017-01-15 MED FILL — TAMSULOSIN HCL 0.4 MG CAP: 0.4 | 30 days supply | Qty: 30 | Fill #0

## 2017-01-15 MED FILL — ONDANSETRON ODT 4 MG TABLET: 4 | 2 days supply | Qty: 8 | Fill #0

## 2017-01-15 NOTE — Discharge Instructions (Signed)
Hydronephrosis Introduction Hydronephrosis is the enlargement of a kidney due to a blockage that stops urine from flowing out of the body. What are the causes? Common causes of this condition include:  A birth (congenital) defect of the kidney.  A congenital defect of the tube through which urine travels (ureter).  Kidney stones.  An enlarged prostate gland.  A tumor.  Cancer of the prostate, bladder, uterus, ovary, or colon.  A blood clot. What are the signs or symptoms? Symptoms of this condition include:  Pain or discomfort in your side (flank).  Swelling of the abdomen.  Pain in the abdomen.  Nausea and vomiting.  Fever.  Pain while passing urine.  Feeling of urgency to urinate.  Frequent urination.  Infection of the urinary tract. In some cases, there are no symptoms. How is this diagnosed? This condition may be diagnosed with:  A medical history.  A physical exam.  Blood and urine tests to check kidney function.  Imaging tests, such as an X-ray, ultrasound, CT scan, or MRI.  A test in which a rigid or flexible telescope (cystoscope) is used to view the site of the blockage. How is this treated? Treatment for this condition depends on where the blockage is located, how long it has been there, and what caused it. The goal of treatment is to remove the blockage. Treatment options include:  A procedure to put in a soft tube to help drain urine.  Antibiotic medicines to treat or prevent infection.  Shock-wave therapy (lithotripsy) to help eliminate kidney stones. Follow these instructions at home:  Get lots of rest.  Drink enough fluid to keep your urine clear or pale yellow.  If you have a drain in, follow your health care provider's instructions about how to care for it.  Take medicines only as directed by your health care provider.  If you were prescribed an antibiotic medicine, finish all of it even if you start to feel better.  Keep all  follow-up visits as directed by your health care provider. This is important. Contact a health care provider if:  You continue to have symptoms after treatment.  You develop new symptoms.  You have a problem with a drainage device.  Your urine becomes cloudy or bloody.  You have a fever. Get help right away if:  You have severe flank or abdominal pain.  You develop vomiting and are unable to keep fluids down. This information is not intended to replace advice given to you by your health care provider. Make sure you discuss any questions you have with your health care provider. Document Released: 09/27/2007 Document Revised: 05/07/2016 Document Reviewed: 11/26/2014  2017 Elsevier  Pregnancy and Urinary Tract Infection WHAT IS A URINARY TRACT INFECTION? A urinary tract infection (UTI) is an infection of any part of the urinary tract. This includes the kidneys, the tubes that connect your kidneys to your bladder (ureters), the bladder, and the tube that carries urine out of your body (urethra). These organs make, store, and get rid of urine in the body. A UTI can be a bladder infection (cystitis) or a kidney infection (pyelonephritis). This infection may be caused by fungi, viruses, and bacteria. Bacteria are the most common cause of UTIs. You are more likely to develop a UTI during pregnancy because:  The physical and hormonal changes your body goes through can make it easier for bacteria to get into your urinary tract.  Your growing baby puts pressure on your uterus and can affect urine  flow. DOES A UTI PLACE MY BABY AT RISK? An untreated UTI during pregnancy could lead to a kidney infection, which can cause health problems that could affect your baby. Possible complications of an untreated UTI include:  Having your baby before 37 weeks of pregnancy (premature).  Having a baby with a low birth weight.  Developing high blood pressure during pregnancy (preeclampsia). WHAT ARE THE  SYMPTOMS OF A UTI? Symptoms of a UTI include:  Fever.  Frequent urination or passing small amounts of urine frequently.  Needing to urinate urgently.  Pain or a burning sensation with urination.  Urine that smells bad or unusual.  Cloudy urine.  Pain in the lower abdomen or back.  Trouble urinating.  Blood in the urine.  Vomiting or being less hungry than normal.  Diarrhea or abdominal pain.  Vaginal discharge. WHAT ARE THE TREATMENT OPTIONS FOR A UTI DURING PREGNANCY? Treatment for this condition may include:  Antibiotic medicines that are safe to take during pregnancy.  Other medicines to treat less common causes of UTI. HOW CAN I PREVENT A UTI? To prevent a UTI:  Go to the bathroom as soon as you feel the need.  Always wipe from front to back.  Wash your genital area with soap and warm water daily.  Empty your bladder before and after sex.  Wear cotton underwear.  Limit your intake of high sugar foods or drinks, such as regular soda, juice, and sweets..  Drink 6-8 glasses of water daily.  Do not wear tight-fitting pants.  Do not douche or use deodorant sprays.  Do not drink alcohol, caffeine, or carbonated drinks. These can irritate the bladder. WHEN SHOULD I SEEK MEDICAL CARE? Seek medical care if:  Your symptoms do not improve or get worse.  You have a fever after two days of treatment.  You have a rash.  You have abnormal vaginal discharge.  You have back or side pain.  You have chills.  You have nausea and vomiting. WHEN SHOULD I SEEK IMMEDIATE MEDICAL CARE? Seek immediate medical care if you are pregnant and:  You feel contractions in your uterus.  You have lower belly pain.  You have a gush of fluid from your vagina.  You have blood in your urine.  You are vomiting and cannot keep down any medicines or water. This information is not intended to replace advice given to you by your health care provider. Make sure you discuss any  questions you have with your health care provider. Document Released: 03/27/2011 Document Revised: 05/04/2016 Document Reviewed: 10/21/2015 Elsevier Interactive Patient Education  2017 [employer]

## 2017-01-15 NOTE — MAU Note (Signed)
Pt c/o UTI symptoms that started last week-burning, bladder spasm. Has taken Macrobid-finished on Tuesday, and azo. Tonight pain worse, took percocet that Dr Senaida Oresichardson prescribed, has not helped. Rates 6/10. Denies contractions, vag bleeding, LOF. +FM

## 2017-01-15 NOTE — MAU Provider Note (Signed)
History     CSN: [id number]  Arrival date and time: 01/15/17 [id number]   First Provider Initiated Contact with Patient 01/15/17 [phone]      Chief Complaint  Patient presents with  . Dysuria   HPI Ms. Laura Hall is a 26 y.o. G1P0 at 1226w2d who presents to MAU today with complaint of dysuria and frequency since yesterday. The patient states a history of frequent UTIs. She was just treated with Macrobid last week and completed treatment. Per the patient UA in the office yesterday was normal prior to onset of symptoms. She has tried AZO tabs and Percocet without relief. Last Percocet at midnight. She denies vaginal bleeding or LOF. She has noted some mild tightening of her abdomen over the last few hours. She reports normal fetal movement.   OB History    Gravida Para Term Preterm AB Living   1             SAB TAB Ectopic Multiple Live Births                  Past Medical History:  Diagnosis Date  . Anxiety     Past Surgical History:  Procedure Laterality Date  . nose set    . WISDOM TOOTH EXTRACTION      Family History  Problem Relation Age of Onset  . Hypertension Father   . Hyperlipidemia Father   . Hypertension Paternal Grandfather     Social History  Substance Use Topics  . Smoking status: Never Smoker  . Smokeless tobacco: Never Used  . Alcohol use No    Allergies: No Known Allergies  Prescriptions Prior to Admission  Medication Sig Dispense Refill Last Dose  . nitrofurantoin, macrocrystal-monohydrate, (MACROBID) 100 MG capsule Take 1 capsule (100 mg total) by mouth 2 (two) times daily. 10 capsule 0 Past Week at Unknown time  . oxyCODONE-acetaminophen (PERCOCET/ROXICET) 5-325 MG tablet Take by mouth every 4 (four) hours as needed for severe pain.   01/15/2017 at 0030  . Prenatal Vit-Fe Fumarate-FA (PRENATAL MULTIVITAMIN) TABS tablet Take 1 tablet by mouth daily at 12 noon.   01/14/2017 at Unknown time  . ranitidine (ZANTAC) 150 MG tablet Take 150 mg by mouth 2  (two) times daily.   01/15/2017 at Unknown time  . ACETAMINOPHEN PO Take by mouth.       Review of Systems  Constitutional: Negative for fever.  Gastrointestinal: Positive for abdominal pain. Negative for constipation, diarrhea, nausea and vomiting.  Genitourinary: Positive for dysuria and frequency. Negative for flank pain, urgency, vaginal bleeding and vaginal discharge.   Physical Exam   Blood pressure 143/88, pulse 115, temperature 98.9 F (37.2 C), temperature source Oral, resp. rate 18, height 5\' 7"  (1.702 m), weight 154 lb (69.9 kg), last menstrual period 07/08/2016, SpO2 99 %.  Physical Exam  Nursing note and vitals reviewed. Constitutional: She is oriented to person, place, and time. She appears well-developed and well-nourished. No distress.  HENT:  Head: Normocephalic and atraumatic.  Cardiovascular: Normal rate.   Respiratory: Effort normal.  GI: Soft. She exhibits no distension and no mass. There is no tenderness. There is no rebound, no guarding and no CVA tenderness.  Neurological: She is alert and oriented to person, place, and time.  Skin: Skin is warm and dry. No erythema.  Psychiatric: She has a normal mood and affect.    Results for orders placed or performed during the hospital encounter of 01/15/17 (from the past 24 hour(s))  Urinalysis, Routine  w reflex microscopic     Status: Abnormal   Collection Time: 01/15/17  5:47 AM  Result Value Ref Range   Color, Urine AMBER (A) YELLOW   APPearance CLOUDY (A) CLEAR   Specific Gravity, Urine 1.017 1.005 - 1.030   pH 5.0 5.0 - 8.0   Glucose, UA NEGATIVE NEGATIVE mg/dL   Hgb urine dipstick SMALL (A) NEGATIVE   Bilirubin Urine NEGATIVE NEGATIVE   Ketones, ur NEGATIVE NEGATIVE mg/dL   Protein, ur 30 (A) NEGATIVE mg/dL   Nitrite POSITIVE (A) NEGATIVE   Leukocytes, UA MODERATE (A) NEGATIVE   RBC / HPF 6-30 0 - 5 RBC/hpf   WBC, UA TOO NUMEROUS TO COUNT 0 - 5 WBC/hpf   Bacteria, UA MANY (A) NONE SEEN   Squamous  Epithelial / LPF 0-5 (A) NONE SEEN   WBC Clumps PRESENT    Mucous PRESENT   CBC with Differential/Platelet     Status: Abnormal   Collection Time: 01/15/17  6:36 AM  Result Value Ref Range   WBC 17.0 (H) 4.0 - 10.5 K/uL   RBC 4.20 3.87 - 5.11 MIL/uL   Hemoglobin 12.5 12.0 - 15.0 g/dL   HCT 16.1 09.6 - 04.5 %   MCV 86.9 78.0 - 100.0 fL   MCH 29.8 26.0 - 34.0 pg   MCHC 34.2 30.0 - 36.0 g/dL   RDW 40.9 81.1 - 91.4 %   Platelets 293 150 - 400 K/uL   Neutrophils Relative % 89 %   Neutro Abs 15.0 (H) 1.7 - 7.7 K/uL   Lymphocytes Relative 7 %   Lymphs Abs 1.3 0.7 - 4.0 K/uL   Monocytes Relative 4 %   Monocytes Absolute 0.7 0.1 - 1.0 K/uL   Eosinophils Relative 0 %   Eosinophils Absolute 0.0 0.0 - 0.7 K/uL   Basophils Relative 0 %   Basophils Absolute 0.0 0.0 - 0.1 K/uL  Comprehensive metabolic panel     Status: Abnormal   Collection Time: 01/15/17  6:36 AM  Result Value Ref Range   Sodium 135 135 - 145 mmol/L   Potassium 4.5 3.5 - 5.1 mmol/L   Chloride 104 101 - 111 mmol/L   CO2 22 22 - 32 mmol/L   Glucose, Bld 96 65 - 99 mg/dL   BUN 8 6 - 20 mg/dL   Creatinine, Ser 7.82 0.44 - 1.00 mg/dL   Calcium 8.5 (L) 8.9 - 10.3 mg/dL   Total Protein 6.7 6.5 - 8.1 g/dL   Albumin 3.2 (L) 3.5 - 5.0 g/dL   AST 30 15 - 41 U/L   ALT 16 14 - 54 U/L   Alkaline Phosphatase 110 38 - 126 U/L   Total Bilirubin 0.5 0.3 - 1.2 mg/dL   GFR calc non Af Amer >60 >60 mL/min   GFR calc Af Amer >60 >60 mL/min   Anion gap 9 5 - 15   US Renal  Result Date: 01/15/2017 CLINICAL DATA:  Hematuria.  Elevated white count.  UTI. EXAM: RENAL / URINARY TRACT ULTRASOUND COMPLETE COMPARISON:  No prior. FINDINGS: Right Kidney: Length: 11.9 cm. Echogenicity within normal limits. No mass. Moderate right hydronephrosis and hydroureter. Debris in the renal collecting system cannot be excluded. Left Kidney: Length: 11.1 cm. Echogenicity within normal limits. No mass or hydronephrosis visualized. Bladder: No bladder distention.  Left ureteral jet noted. Right ureteral jet not noted. Pregnancy is noted. IMPRESSION: Moderate right hydronephrosis and hydroureter. Debris in the right renal collecting system cannot be excluded . Electronically Signed  ByMaisie Fus  Register   On: 01/15/2017 07:55    Fetal Monitoring: Baseline: 140 bpm Variability: moderate Accelerations: 10 x 10 Decelerations: none Contractions: q 2-4 minutes   MAU Course  Procedures None  MDM UA today  FFN collected prior to cervical exam. Discarded since cervix is closed and thick.  CBC, CMP obtained IV LR bolus  Discussed patient with Dr. Senaida Ores. Advised renal US today, IV fluids, 1 mg IV Dilaudid and 2 G Rocephin.  Urine culture sent Discussed results with Dr. Jackelyn Knife. Ok for discharge at this time with Rx for Keflex TID and Flomax daily. Continue Percocet and Tylenol PRN for pain.   Assessment and Plan  A: SIUP at [redacted]w[redacted]d Preterm contractions, second trimester  UTI Right hydronephrosis  P:  Discharge home Rx for Keflex and Flomax sent to patient's pharmacy Warning signs for worsening condition discussed Patient advised to follow-up with [facility] next week or call over the weekend with worsening symptoms Patient may return to MAU as needed or if her condition were to change or worsen  Marny Lowenstein, PA-C  01/15/2017, 8:05 AM

## 2017-01-17 LAB — CULTURE, OB URINE

## 2017-01-22 DIAGNOSIS — O99810 Abnormal glucose complicating pregnancy: Secondary | ICD-10-CM | POA: Diagnosis not present

## 2017-01-25 DIAGNOSIS — Z3403 Encounter for supervision of normal first pregnancy, third trimester: Secondary | ICD-10-CM | POA: Diagnosis not present

## 2017-01-25 DIAGNOSIS — Z3A28 28 weeks gestation of pregnancy: Secondary | ICD-10-CM | POA: Diagnosis not present

## 2017-01-25 DIAGNOSIS — R8279 Other abnormal findings on microbiological examination of urine: Secondary | ICD-10-CM | POA: Diagnosis not present

## 2017-01-25 DIAGNOSIS — Z6791 Unspecified blood type, Rh negative: Secondary | ICD-10-CM | POA: Diagnosis not present

## 2017-01-25 DIAGNOSIS — Z23 Encounter for immunization: Secondary | ICD-10-CM | POA: Diagnosis not present

## 2017-02-09 MED FILL — CEPHALEXIN 500 MG CAPSULE: 500 | 30 days supply | Qty: 30 | Fill #0

## 2017-03-10 DIAGNOSIS — Z3403 Encounter for supervision of normal first pregnancy, third trimester: Secondary | ICD-10-CM | POA: Diagnosis not present

## 2017-03-10 DIAGNOSIS — Z3685 Encounter for antenatal screening for Streptococcus B: Secondary | ICD-10-CM | POA: Diagnosis not present

## 2017-03-10 DIAGNOSIS — Z3A35 35 weeks gestation of pregnancy: Secondary | ICD-10-CM | POA: Diagnosis not present

## 2017-03-11 LAB — OB RESULTS CONSOLE GBS: STREP GROUP B AG: NEGATIVE

## 2017-03-19 DIAGNOSIS — O26849 Uterine size-date discrepancy, unspecified trimester: Secondary | ICD-10-CM | POA: Diagnosis not present

## 2017-03-19 DIAGNOSIS — Z3A36 36 weeks gestation of pregnancy: Secondary | ICD-10-CM | POA: Diagnosis not present

## 2017-03-19 DIAGNOSIS — O365930 Maternal care for other known or suspected poor fetal growth, third trimester, not applicable or unspecified: Secondary | ICD-10-CM | POA: Diagnosis not present

## 2017-03-19 MED FILL — CEPHALEXIN 500 MG CAPSULE: 500 | 30 days supply | Qty: 30 | Fill #1

## 2017-03-22 DIAGNOSIS — Z3A36 36 weeks gestation of pregnancy: Secondary | ICD-10-CM | POA: Diagnosis not present

## 2017-03-22 DIAGNOSIS — O26849 Uterine size-date discrepancy, unspecified trimester: Secondary | ICD-10-CM | POA: Diagnosis not present

## 2017-03-25 DIAGNOSIS — O26849 Uterine size-date discrepancy, unspecified trimester: Secondary | ICD-10-CM | POA: Diagnosis not present

## 2017-03-25 DIAGNOSIS — Z3A37 37 weeks gestation of pregnancy: Secondary | ICD-10-CM | POA: Diagnosis not present

## 2017-04-01 ENCOUNTER — Encounter (HOSPITAL_COMMUNITY): Payer: Self-pay | Admitting: *Deleted

## 2017-04-01 ENCOUNTER — Inpatient Hospital Stay (HOSPITAL_COMMUNITY)
Admission: AD | Admit: 2017-04-01 | Discharge: 2017-04-03 | DRG: 775 | Disposition: A | Discharge status: Home / Self Care | Payer: 59 | Source: Ambulatory Visit | Attending: Obstetrics and Gynecology | Admitting: Obstetrics and Gynecology

## 2017-04-01 DIAGNOSIS — O4202 Full-term premature rupture of membranes, onset of labor within 24 hours of rupture: Secondary | ICD-10-CM | POA: Diagnosis present

## 2017-04-01 DIAGNOSIS — Z3A38 38 weeks gestation of pregnancy: Secondary | ICD-10-CM | POA: Diagnosis not present

## 2017-04-01 DIAGNOSIS — O365930 Maternal care for other known or suspected poor fetal growth, third trimester, not applicable or unspecified: Secondary | ICD-10-CM | POA: Diagnosis present

## 2017-04-01 HISTORY — DX: Urinary tract infection, site not specified: N39.0

## 2017-04-01 LAB — URINALYSIS, ROUTINE W REFLEX MICROSCOPIC
BILIRUBIN URINE: NEGATIVE
GLUCOSE, UA: NEGATIVE mg/dL
KETONES UR: NEGATIVE mg/dL
Leukocytes, UA: NEGATIVE
NITRITE: NEGATIVE
PROTEIN: NEGATIVE mg/dL
Specific Gravity, Urine: 1.005 (ref 1.005–1.030)
pH: 6 (ref 5.0–8.0)

## 2017-04-01 LAB — COMPREHENSIVE METABOLIC PANEL
ALK PHOS: 179 U/L — AB (ref 38–126)
ALT: 14 U/L (ref 14–54)
ANION GAP: 8 (ref 5–15)
AST: 21 U/L (ref 15–41)
Albumin: 2.8 g/dL — ABNORMAL LOW (ref 3.5–5.0)
BILIRUBIN TOTAL: 0.4 mg/dL (ref 0.3–1.2)
BUN: 14 mg/dL (ref 6–20)
CHLORIDE: 106 mmol/L (ref 101–111)
CO2: 22 mmol/L (ref 22–32)
Calcium: 8.9 mg/dL (ref 8.9–10.3)
Creatinine, Ser: 0.58 mg/dL (ref 0.44–1.00)
GLUCOSE: 78 mg/dL (ref 65–99)
POTASSIUM: 4.1 mmol/L (ref 3.5–5.1)
Sodium: 136 mmol/L (ref 135–145)
Total Protein: 6.6 g/dL (ref 6.5–8.1)

## 2017-04-01 LAB — CBC
HEMATOCRIT: 36.8 % (ref 36.0–46.0)
HEMOGLOBIN: 11.7 g/dL — AB (ref 12.0–15.0)
MCH: 26.4 pg (ref 26.0–34.0)
MCHC: 31.8 g/dL (ref 30.0–36.0)
MCV: 82.9 fL (ref 78.0–100.0)
Platelets: 234 10*3/uL (ref 150–400)
RBC: 4.44 MIL/uL (ref 3.87–5.11)
RDW: 14.1 % (ref 11.5–15.5)
WBC: 11.2 10*3/uL — ABNORMAL HIGH (ref 4.0–10.5)

## 2017-04-01 LAB — TYPE AND SCREEN
ABO/RH(D): O NEG
ANTIBODY SCREEN: NEGATIVE

## 2017-04-01 LAB — POCT FERN TEST

## 2017-04-01 LAB — PROTEIN / CREATININE RATIO, URINE
CREATININE, URINE: 29 mg/dL
Total Protein, Urine: <6 mg/dL

## 2017-04-01 LAB — ABO/RH: ABO/RH(D): O NEG

## 2017-04-01 MED ORDER — EPHEDRINE 5 MG/ML INJ
10.0000 mg | INTRAVENOUS | Status: DC | PRN
  Filled 2017-04-01: qty 2

## 2017-04-01 MED ORDER — ACETAMINOPHEN 325 MG PO TABS: 650.0000 mg | ORAL_TABLET | ORAL | Status: DC | PRN

## 2017-04-01 MED ORDER — LACTATED RINGERS IV SOLN: INTRAVENOUS | Status: DC

## 2017-04-01 MED ORDER — BENZOCAINE-MENTHOL 20-0.5 % EX AERO
1.0000 "application " | INHALATION_SPRAY | CUTANEOUS | Status: DC | PRN
  Administered 2017-04-01: 1 via TOPICAL
  Filled 2017-04-01: qty 56

## 2017-04-01 MED ORDER — PHENYLEPHRINE 40 MCG/ML (10ML) SYRINGE FOR IV PUSH (FOR BLOOD PRESSURE SUPPORT)
80.0000 ug | PREFILLED_SYRINGE | INTRAVENOUS | Status: DC | PRN
  Filled 2017-04-01: qty 5

## 2017-04-01 MED ORDER — ZOLPIDEM TARTRATE 5 MG PO TABS: 5.0000 mg | ORAL_TABLET | Freq: Every evening | ORAL | Status: DC | PRN

## 2017-04-01 MED ORDER — FENTANYL 2.5 MCG/ML BUPIVACAINE 1/10 % EPIDURAL INFUSION (WH - ANES): 14.0000 mL/h | INTRAMUSCULAR | Status: DC | PRN

## 2017-04-01 MED ORDER — OXYCODONE HCL 5 MG PO TABS: 10.0000 mg | ORAL_TABLET | ORAL | Status: DC | PRN

## 2017-04-01 MED ORDER — DIPHENHYDRAMINE HCL 50 MG/ML IJ SOLN: 12.5000 mg | INTRAMUSCULAR | Status: DC | PRN

## 2017-04-01 MED ORDER — PRENATAL MULTIVITAMIN CH
1.0000 | ORAL_TABLET | Freq: Every day | ORAL | Status: DC
  Administered 2017-04-02 – 2017-04-03 (×2): 1 via ORAL
  Filled 2017-04-01 (×2): qty 1

## 2017-04-01 MED ORDER — LACTATED RINGERS IV SOLN: 500.0000 mL | INTRAVENOUS | Status: DC | PRN

## 2017-04-01 MED ORDER — WITCH HAZEL-GLYCERIN EX PADS: 1.0000 "application " | MEDICATED_PAD | CUTANEOUS | Status: DC | PRN

## 2017-04-01 MED ORDER — ONDANSETRON HCL 4 MG PO TABS
4.0000 mg | ORAL_TABLET | ORAL | Status: DC | PRN
Start: 2017-04-01 — End: 2017-04-03

## 2017-04-01 MED ORDER — ONDANSETRON HCL 4 MG/2ML IJ SOLN: 4.0000 mg | INTRAMUSCULAR | Status: DC | PRN

## 2017-04-01 MED ORDER — BUTORPHANOL TARTRATE 1 MG/ML IJ SOLN
1.0000 mg | INTRAMUSCULAR | Status: DC | PRN
  Administered 2017-04-01: 1 mg via INTRAVENOUS
  Filled 2017-04-01: qty 1

## 2017-04-01 MED ORDER — FAMOTIDINE 20 MG PO TABS
20.0000 mg | ORAL_TABLET | Freq: Every day | ORAL | Status: DC
  Administered 2017-04-01 – 2017-04-02 (×2): 20 mg via ORAL
  Filled 2017-04-01 (×2): qty 1

## 2017-04-01 MED ORDER — SENNOSIDES-DOCUSATE SODIUM 8.6-50 MG PO TABS
2.0000 | ORAL_TABLET | ORAL | Status: DC
  Administered 2017-04-01 – 2017-04-02 (×2): 2 via ORAL
  Filled 2017-04-01 (×2): qty 2

## 2017-04-01 MED ORDER — FLEET ENEMA 7-19 GM/118ML RE ENEM: 1.0000 | ENEMA | RECTAL | Status: DC | PRN

## 2017-04-01 MED ORDER — IBUPROFEN 600 MG PO TABS
600.0000 mg | ORAL_TABLET | Freq: Four times a day (QID) | ORAL | Status: DC
  Administered 2017-04-01 – 2017-04-03 (×8): 600 mg via ORAL
  Filled 2017-04-01 (×8): qty 1

## 2017-04-01 MED ORDER — OXYTOCIN 40 UNITS IN LACTATED RINGERS INFUSION - SIMPLE MED
2.5000 [IU]/h | INTRAVENOUS | Status: DC
  Filled 2017-04-01: qty 1000

## 2017-04-01 MED ORDER — OXYCODONE HCL 5 MG PO TABS: 5.0000 mg | ORAL_TABLET | ORAL | Status: DC | PRN

## 2017-04-01 MED ORDER — DIPHENHYDRAMINE HCL 25 MG PO CAPS: 25.0000 mg | ORAL_CAPSULE | Freq: Four times a day (QID) | ORAL | Status: DC | PRN

## 2017-04-01 MED ORDER — COCONUT OIL OIL
1.0000 "application " | TOPICAL_OIL | Status: DC | PRN
  Administered 2017-04-02: 1 via TOPICAL
  Filled 2017-04-01: qty 120

## 2017-04-01 MED ORDER — LACTATED RINGERS IV SOLN: 500.0000 mL | Freq: Once | INTRAVENOUS | Status: DC

## 2017-04-01 MED ORDER — DIBUCAINE 1 % RE OINT
1.0000 | TOPICAL_OINTMENT | RECTAL | Status: DC | PRN
Start: 2017-04-01 — End: 2017-04-03

## 2017-04-01 MED ORDER — SIMETHICONE 80 MG PO CHEW: 80.0000 mg | CHEWABLE_TABLET | ORAL | Status: DC | PRN

## 2017-04-01 MED ORDER — SOD CITRATE-CITRIC ACID 500-334 MG/5ML PO SOLN: 30.0000 mL | ORAL | Status: DC | PRN

## 2017-04-01 MED ORDER — OXYTOCIN BOLUS FROM INFUSION
500.0000 mL | Freq: Once | INTRAVENOUS | Status: AC
  Administered 2017-04-01: 500 mL/h via INTRAVENOUS

## 2017-04-01 MED ORDER — LIDOCAINE HCL (PF) 1 % IJ SOLN
30.0000 mL | INTRAMUSCULAR | Status: AC | PRN
  Administered 2017-04-01: 30 mL via SUBCUTANEOUS
  Filled 2017-04-01: qty 30

## 2017-04-01 MED ORDER — CALCIUM CARBONATE ANTACID 500 MG PO CHEW
2.0000 | CHEWABLE_TABLET | Freq: Three times a day (TID) | ORAL | Status: DC | PRN
  Administered 2017-04-02: 400 mg via ORAL
  Filled 2017-04-01 (×2): qty 2

## 2017-04-01 MED ORDER — ONDANSETRON HCL 4 MG/2ML IJ SOLN: 4.0000 mg | Freq: Four times a day (QID) | INTRAMUSCULAR | Status: DC | PRN

## 2017-04-01 MED ORDER — OXYCODONE-ACETAMINOPHEN 5-325 MG PO TABS
2.0000 | ORAL_TABLET | ORAL | Status: DC | PRN
Start: 2017-04-01 — End: 2017-04-01

## 2017-04-01 MED ORDER — OXYCODONE-ACETAMINOPHEN 5-325 MG PO TABS: 1.0000 | ORAL_TABLET | ORAL | Status: DC | PRN

## 2017-04-01 NOTE — MAU Note (Signed)
c/o SROM @ 0630 this AM;

## 2017-04-01 NOTE — MAU Note (Signed)
Scheduled for  Induction on Wednesday;

## 2017-04-01 NOTE — Lactation Note (Signed)
This note was copied from a baby's chart. Lactation Consultation Note  P1, Mother [facility] RN.  Baby 9 hours old and < 6 lbs. Reviewed hand expression w/ mother and she can easily express drops. Encouraged hand expression after feedings and often. Suggest spoon feeding after feedings.  Discussed basics. Baby latched in cradle hold.  Sucks and swallows observed. Mom encouraged to feed baby 8-12 times/24 hours and with feeding cues at least q 3 hours. Limit feedings to 30 min and be aware if baby starts to hand out w/ NNS. Baby does well at the breast. Mom made aware of O/P services, breastfeeding support groups, community resources, and our phone # for post-discharge questions.  FOB will get UMR pump tomorrow.   Patient Name: Laura Hall [id number] Date: 04/01/2017 Reason for consult: Infant < 6lbs;Initial assessment   Maternal Data Has patient been taught Hand Expression?: Yes Does the patient have breastfeeding experience prior to this delivery?: No  Feeding Feeding Type: Breast Fed Length of feed: 20 min  LATCH Score/Interventions Latch: Grasps breast easily, tongue down, lips flanged, rhythmical sucking.  Audible Swallowing: A few with stimulation  Type of Nipple: Everted at rest and after stimulation  Comfort (Breast/Nipple): Soft / non-tender     Hold (Positioning): Assistance needed to correctly position infant at breast and maintain latch.  LATCH Score: 8  Lactation Tools Discussed/Used     Consult Status Consult Status: Follow-up Date: 04/02/17 Follow-up type: In-patient    Dahlia Byes [facility] 04/01/2017, 9:55 PM

## 2017-04-02 LAB — RPR: RPR Ser Ql: NONREACTIVE

## 2017-04-02 LAB — CBC
HEMATOCRIT: 30.7 % — AB (ref 36.0–46.0)
Hemoglobin: 9.9 g/dL — ABNORMAL LOW (ref 12.0–15.0)
MCH: 26.7 pg (ref 26.0–34.0)
MCHC: 32.2 g/dL (ref 30.0–36.0)
MCV: 82.7 fL (ref 78.0–100.0)
Platelets: 188 10*3/uL (ref 150–400)
RBC: 3.71 MIL/uL — ABNORMAL LOW (ref 3.87–5.11)
RDW: 14.3 % (ref 11.5–15.5)
WBC: 16.1 10*3/uL — AB (ref 4.0–10.5)

## 2017-04-02 MED ORDER — RHO D IMMUNE GLOBULIN 1500 UNIT/2ML IJ SOSY
300.0000 ug | PREFILLED_SYRINGE | Freq: Once | INTRAMUSCULAR | Status: AC
Start: 2017-04-02 — End: 2017-04-02
  Administered 2017-04-02: 300 ug via INTRAMUSCULAR
  Filled 2017-04-02: qty 2

## 2017-04-02 NOTE — Progress Notes (Signed)
Post Partum Day 1 Subjective: no complaints, up ad lib and tolerating PO  Objective: Blood pressure 119/63, pulse 69, temperature 98.4 F (36.9 C), temperature source Oral, resp. rate 18, last menstrual period 07/08/2016, SpO2 100 %, unknown if currently breastfeeding.  Physical Exam:  General: alert and cooperative Lochia: appropriate Uterine Fundus: firm    Recent Labs  04/01/17 1000 04/02/17 0538  HGB 11.7* 9.9*  HCT 36.8 30.7*    Assessment/Plan: Plan for discharge tomorrow  Baby feeding well   LOS: 1 day   Oliver Pila 04/02/2017, 9:01 AM

## 2017-04-02 NOTE — Discharge Summary (Signed)
OB Discharge Summary     Patient Name: Laura Hall DOB: Jun 19, 1991 MRN: [id number]  Date of admission: 04/01/2017 Delivering MD: Sherian Rein   Date of discharge: 04/03/2017  Admitting diagnosis: WATER BROKE Intrauterine pregnancy: [redacted]w[redacted]d     Secondary diagnosis:  Principal Problem:   SVD (spontaneous vaginal delivery) Active Problems:   Normal labor  Additional problems:none     Discharge diagnosis: Term Pregnancy Delivered                                                                                                Post partum procedures:rhogam  Augmentation: none  Complications: None  Hospital course:  Onset of Labor With Vaginal Delivery     26 y.o. yo G1P1001 at [redacted]w[redacted]d was admitted in Active Labor on 04/01/2017. Patient had an uncomplicated labor course as follows:  Membrane Rupture Time/Date: 6:30 AM ,04/01/2017   Intrapartum Procedures: Episiotomy: None [1]                                         Lacerations:  2nd degree [3]  Patient had a delivery of a Viable infant. 04/01/2017  Information for the patient's newborn:  Elinor, Kleine Girl Dwight [[id number]]  Delivery Method: Vaginal, Spontaneous Delivery (Filed from Delivery Summary)    Pateint had an uncomplicated postpartum course.  She is ambulating, tolerating a regular diet, passing flatus, and urinating well. Patient is discharged home in stable condition on 04/03/17.   Physical exam  Vitals:   04/02/17 0552 04/02/17 1719 04/02/17 2333 04/03/17 0551  BP: 119/63 116/65  121/63  Pulse: 69 92  72  Resp: Temp: 98.4 F (36.9 C) 98.3 F (36.8 C)  98.2 F (36.8 C)  TempSrc: Oral Oral    SpO2: 100%     Weight:   69.9 kg (154 lb)   Height:    (1.702 m)    General: alert and cooperative Lochia: appropriate Uterine Fundus: firm  Labs: Lab Results  Component Value Date   WBC 16.1 (H) 04/02/2017   HGB 9.9 (L) 04/02/2017   HCT 30.7 (L) 04/02/2017   MCV 82.7 04/02/2017   PLT 188  04/02/2017   CMP Latest Ref Rng & Units 04/01/2017  Glucose 65 - 99 mg/dL 78  BUN 6 - 20 mg/dL 14  Creatinine 2.13 - 0.86 mg/dL 5.78  Sodium 469 - 629 mmol/L 136  Potassium 3.5 - 5.1 mmol/L 4.1  Chloride 101 - 111 mmol/L 106  CO2 22 - 32 mmol/L 22  Calcium 8.9 - 10.3 mg/dL 8.9  Total Protein 6.5 - 8.1 g/dL 6.6  Total Bilirubin 0.3 - 1.2 mg/dL 0.4  Alkaline Phos 38 - 126 U/L 179(H)  AST 15 - 41 U/L 21  ALT 14 - 54 U/L 14    Discharge instruction: per After Visit Summary and "Baby and Me Booklet".  After visit meds:  Allergies as of 04/03/2017   No Known Allergies     Medication List  STOP taking these medications   ranitidine 150 MG tablet Commonly known as:  ZANTAC     TAKE these medications   acetaminophen 325 MG tablet Commonly known as:  TYLENOL Take 2 tablets (650 mg total) by mouth every 4 (four) hours as needed (for pain scale < 4).   calcium carbonate 500 MG chewable tablet Commonly known as:  TUMS - dosed in mg elemental calcium Chew 2 tablets by mouth 3 (three) times daily as needed for indigestion or heartburn.   ibuprofen 600 MG tablet Commonly known as:  ADVIL,MOTRIN Take 1 tablet (600 mg total) by mouth every 6 (six) hours.   prenatal multivitamin Tabs tablet Take 1 tablet by mouth daily at 12 noon.       Diet: routine diet  Activity: Advance as tolerated. Pelvic rest for 6 weeks.   Outpatient follow up:6 weeks Follow up Appt:No future appointments. Follow up Visit:No Follow-up on file.  Postpartum contraception: Undecided  Newborn Data: Live born female  Birth Weight: 5 lb 1.7 oz (2316 g) APGAR: 9, 9  Baby Feeding: Breast Disposition:home with mother   04/03/2017 Oliver Pila, MD

## 2017-04-02 NOTE — Lactation Note (Signed)
This note was copied from a baby's chart. Lactation Consultation Note  Patient Name: Laura Hall FAOZH'Y Date: 04/02/2017 Reason for consult: Follow-up assessment Baby at 27 hr of life. Upon entry parents were latching baby in cross cradle on the L breast. Mom denies breast or nipple pain, voiced no concerns. Answered questions about storing milk for return to work. Discussed baby behavior, feeding frequency, baby belly size, voids, wt loss, breast changes, and nipple care. Mom stated she has been manually expressing and spoon feeding "some" but baby has been cluster feeding. Parents are aware of lactation services and support group. Mom will offer the breast on demand q3hr, post express, and supplement per volume guidelines.    Maternal Data    Feeding Feeding Type: Breast Fed Length of feed: 30 min  LATCH Score/Interventions Latch: Repeated attempts needed to sustain latch, nipple held in mouth throughout feeding, stimulation needed to elicit sucking reflex. Intervention(s): Adjust position  Audible Swallowing: A few with stimulation Intervention(s): Skin to skin  Type of Nipple: Everted at rest and after stimulation  Comfort (Breast/Nipple): Soft / non-tender     Hold (Positioning): No assistance needed to correctly position infant at breast. Intervention(s): Skin to skin  LATCH Score: 8  Lactation Tools Discussed/Used     Consult Status Consult Status: Follow-up Date: 04/03/17 Follow-up type: In-patient    Rulon Eisenmenger 04/02/2017, 4:33 PM

## 2017-04-03 LAB — RH IG WORKUP (INCLUDES ABO/RH)
ABO/RH(D): O NEG
FETAL SCREEN: NEGATIVE
Gestational Age(Wks): 38.1
UNIT DIVISION: 0

## 2017-04-03 MED ORDER — ACETAMINOPHEN 325 MG PO TABS: 650.0000 mg | ORAL_TABLET | ORAL | 0 refills | Status: AC | PRN

## 2017-04-03 MED ORDER — IBUPROFEN 600 MG PO TABS: 600.0000 mg | ORAL_TABLET | Freq: Four times a day (QID) | ORAL | 0 refills | Status: AC

## 2017-04-03 NOTE — Progress Notes (Signed)
Post Partum Day 2 Subjective: no complaints and tolerating PO  Objective: Blood pressure 121/63, pulse 72, temperature 98.2 F (36.8 C), resp. rate 18, height  (1.702 m), weight 69.9 kg (154 lb), last menstrual period 07/08/2016, SpO2 100 %, unknown if currently breastfeeding.  Physical Exam:  General: alert and cooperative Lochia: appropriate Uterine Fundus: firm    Recent Labs  04/01/17 1000 04/02/17 0538  HGB 11.7* 9.9*  HCT 36.8 30.7*    Assessment/Plan: Discharge home   LOS: 2 days   Oliver Pila 04/03/2017, 7:21 AM

## 2017-04-03 NOTE — Lactation Note (Signed)
This note was copied from a baby's chart. Lactation Consultation Note  Patient Name: Laura Hall EAVWU'J Date: 04/03/2017  Mom states baby cluster fed last night.  Latching easily.  Baby is now 4-12.5 and a 6 % weight loss.  Recommended mom start post pumping with DEBP and giving expressed milk back to baby.  Mom has her personal DEBP with her and I showed parents how to set up and use.  Lactation outpatient services and support information reviewed and encouraged.  Mom states she will call for appt prn.   Maternal Data    Feeding Feeding Type: Breast Fed Length of feed: 20 min  LATCH Score/Interventions                      Lactation Tools Discussed/Used     Consult Status      Huston Foley 04/03/2017, 10:18 AM

## 2017-04-07 ENCOUNTER — Inpatient Hospital Stay (HOSPITAL_COMMUNITY): Payer: 59

## 2017-04-21 NOTE — H&P (Signed)
Laura Hall is a 26 y.o. female G1P0 presents to [facility] with c/o SROM for clear fluid at 6:30am.  PNC complicated by SGA infant - growth at 11% - nl dopplers and AFI.  [facility]  (History written after successful SVD)  OB History as of 04/16/17    Gravida Para Term Preterm AB Living   1 1 1     1    SAB TAB Ectopic Multiple Live Births         0 1    G1 present No abn pap No STDs  Past Medical History:  Diagnosis Date  . Anxiety   . SVD (spontaneous vaginal delivery) 04/01/2017  . UTI (urinary tract infection)    Past Surgical History:  Procedure Laterality Date  . nose set    . WISDOM TOOTH EXTRACTION     Family History: family history includes Hyperlipidemia in her father; Hypertension in her father and paternal grandfather. Social History:  reports that she has never smoked. She has never used smokeless tobacco. She reports that she does not drink alcohol or use drugs. married, nurse  Meds PNV All NKDA     Maternal Diabetes: No Genetic Screening: Normal Maternal Ultrasounds/Referrals: Abnormal:  Findings:   Isolated choroid plexus cyst Fetal Ultrasounds or other Referrals:  None Maternal Substance Abuse:  No Significant Maternal Medications:  None Significant Maternal Lab Results:  Lab values include: Group B Strep negative Other Comments:  None  Review of Systems  Constitutional: Negative.   HENT: Negative.   Eyes: Negative.   Respiratory: Negative.   Cardiovascular: Negative.   Gastrointestinal: Negative.   Genitourinary: Negative.   Musculoskeletal: Negative.   Skin: Negative.   Neurological: Negative.   Psychiatric/Behavioral: Negative.    Maternal Medical History:  Reason for admission: Rupture of membranes.   Fetal activity: Perceived fetal activity is normal.    Prenatal complications: IUGR.   Prenatal Complications - Diabetes: none.    Dilation: 10 Effacement (%): 100 Station: +1 Exam by:: lee Blood pressure  121/63, pulse 72, temperature 98.2 F (36.8 C), resp. rate 18, height 5\' 7"  (1.702 m), weight 69.9 kg (154 lb), last menstrual period 07/08/2016, SpO2 100 %, unknown if currently breastfeeding. Maternal Exam:  Abdomen: Patient reports no abdominal tenderness. Fundal height is appropriate for gestation.   Estimated fetal weight is 6#.   Fetal presentation: vertex  Introitus: Normal vulva. Normal vagina.    Physical Exam  Constitutional: She is oriented to person, place, and time. She appears well-developed and well-nourished.  HENT:  Head: Normocephalic and atraumatic.  Cardiovascular: Normal rate and regular rhythm.   Respiratory: Effort normal and breath sounds normal. No respiratory distress. She has no wheezes.  GI: Soft. Bowel sounds are normal. She exhibits no distension. There is no tenderness.  Musculoskeletal: Normal range of motion.  Neurological: She is alert and oriented to person, place, and time.  Skin: Skin is warm and dry.  Psychiatric: She has a normal mood and affect. Her behavior is normal.    Prenatal labs: ABO, Rh: --/--/O NEG (04/20 0538) Antibody: NEG (04/19 1000) Rubella: Immune (09/29 0000) RPR: Non Reactive (04/19 1000)  HBsAg: Negative (09/29 0000)  HIV: Non-reactive (09/29 0000)  GBS: Negative (03/29 0000)   EDC 5/2 by LMP cw early US Nl First trimester screen, nl NT Nl anat, except B CP cysts, female Growth at 11% nl dopplers and AFI  Hgb 14.5/Plt 330/Ur Cx neg/ Chl neg/GC neg/Varicella immune/ Nl  AFP/glucola 135 - nl 3 hr GTT/ gbbs neg/ essential panel neg  Assessment/Plan: 25yo G1P0 at 38 with SROM Plan for SVD, augment with pitocin prn Epidural prn Expect SVD   Laura Hall 04/21/2017, 10:09 AM

## 2017-05-11 DIAGNOSIS — Z3009 Encounter for other general counseling and advice on contraception: Secondary | ICD-10-CM | POA: Diagnosis not present

## 2017-05-11 DIAGNOSIS — Z3202 Encounter for pregnancy test, result negative: Secondary | ICD-10-CM | POA: Diagnosis not present

## 2017-05-11 DIAGNOSIS — Z30430 Encounter for insertion of intrauterine contraceptive device: Secondary | ICD-10-CM | POA: Diagnosis not present

## 2017-07-23 DIAGNOSIS — Z975 Presence of (intrauterine) contraceptive device: Secondary | ICD-10-CM | POA: Diagnosis not present

## 2017-07-23 DIAGNOSIS — N390 Urinary tract infection, site not specified: Secondary | ICD-10-CM | POA: Diagnosis not present

## 2017-08-09 MED FILL — NITROFURANTOIN MONO-MCR 100: 100 | 30 days supply | Qty: 30 | Fill #0 | Status: TO

## 2017-08-27 IMAGING — US US RENAL
1 series · 15 of 25 positions shown · non-contrast
Comparison: No prior.

CLINICAL DATA: Hematuria.  Elevated white count.  UTI.

EXAM:
RENAL / URINARY TRACT ULTRASOUND COMPLETE

[Series 1: us renal · 32 acquisitions, 15 frames shown]
[im 1/32]
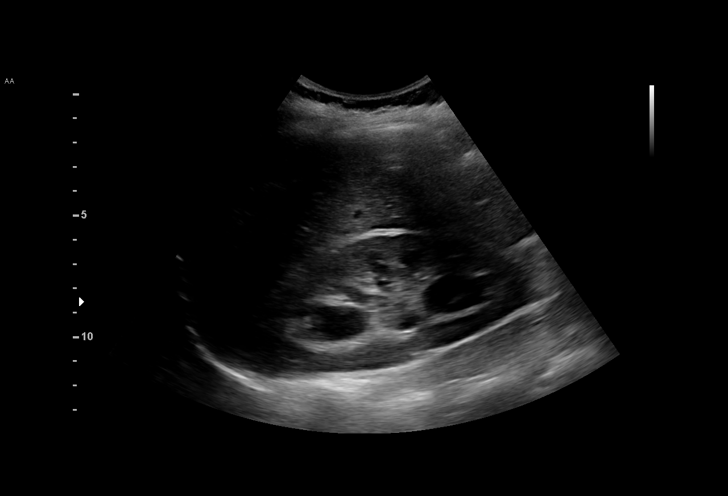
[im 3/32]
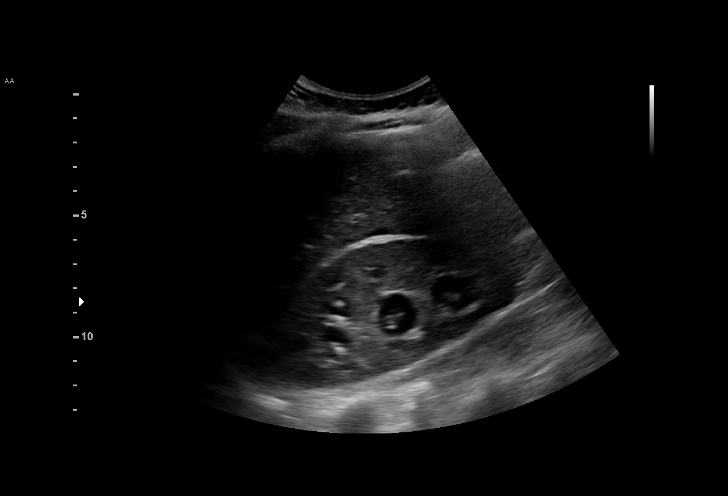
[im 6/32]
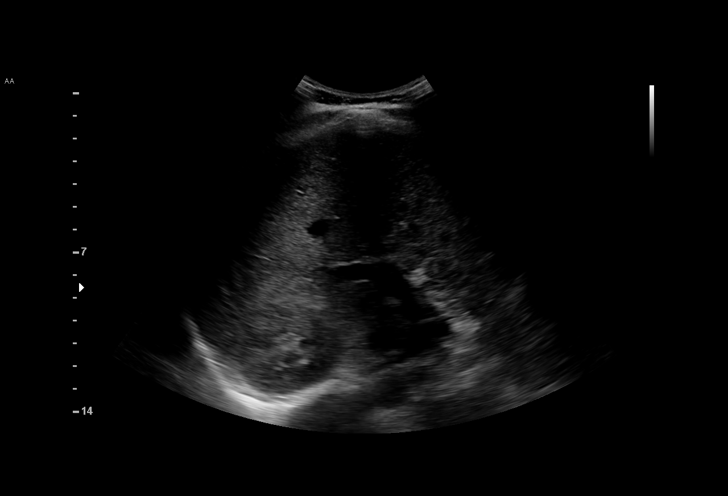
[im 7/32]
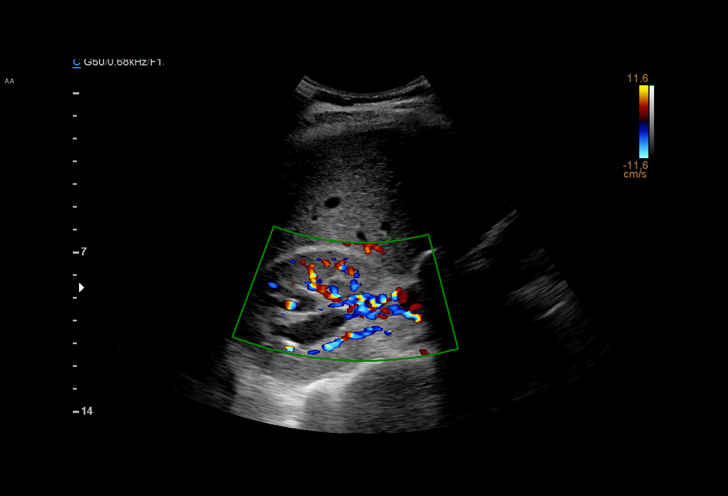
[im 10/32]
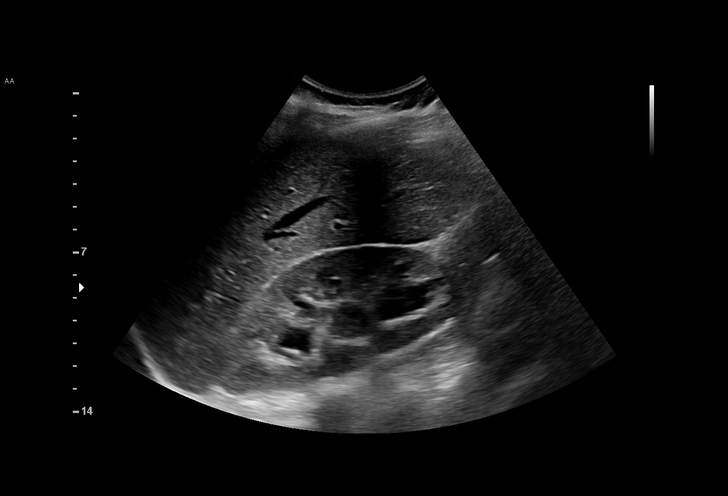
[im 12/32]
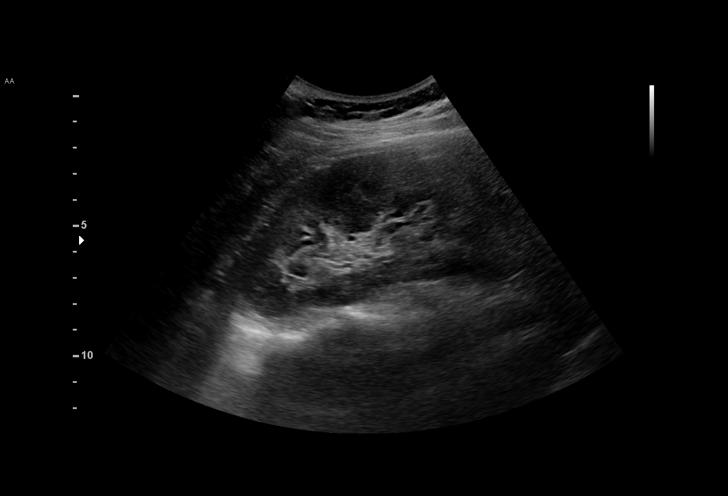
[im 13/32]
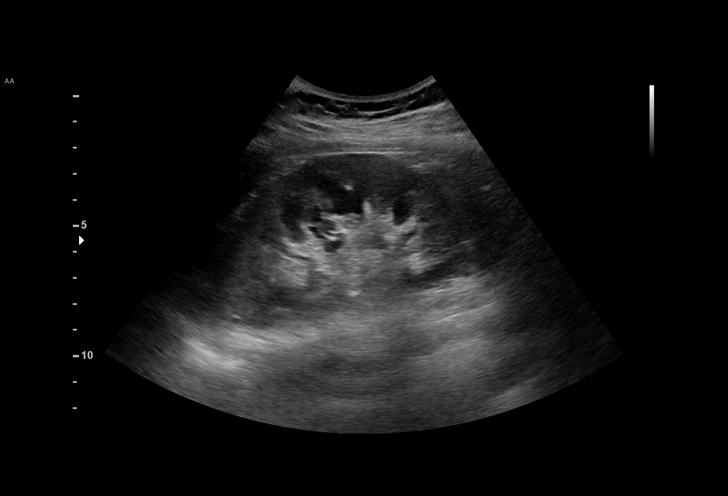
[im 16/32]
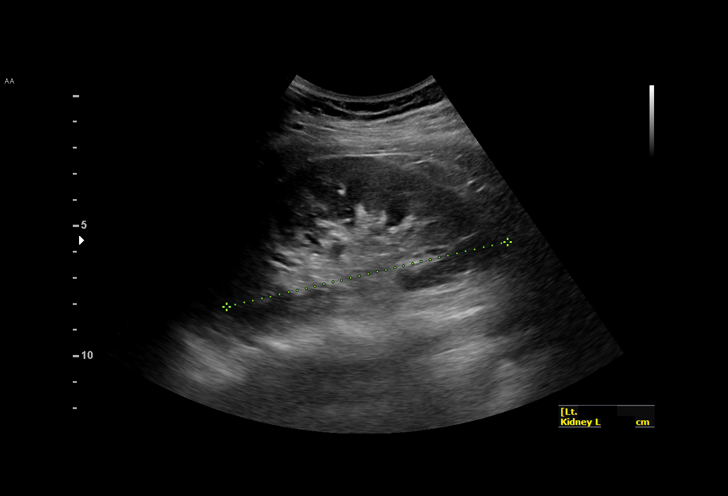
[im 19/32]
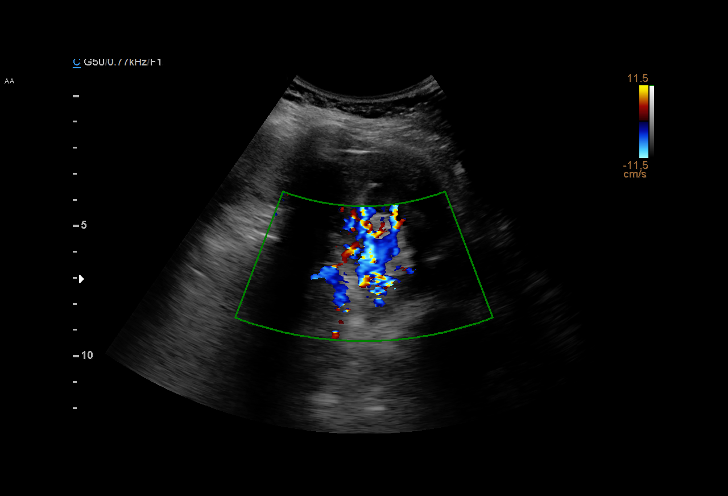
[im 20/32]
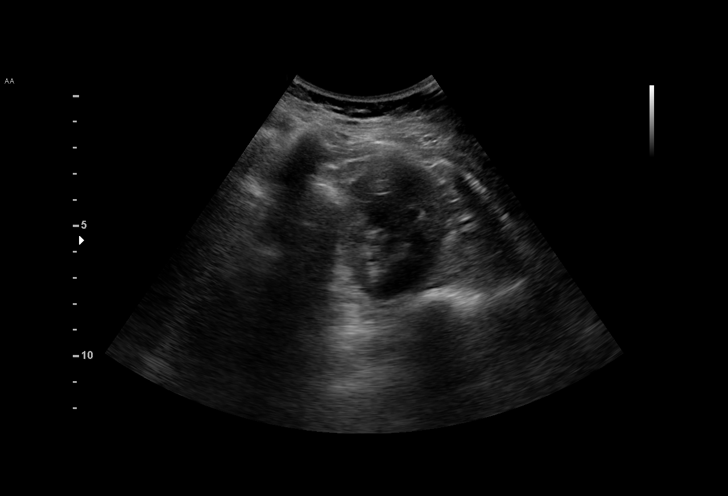
[im 22/32]
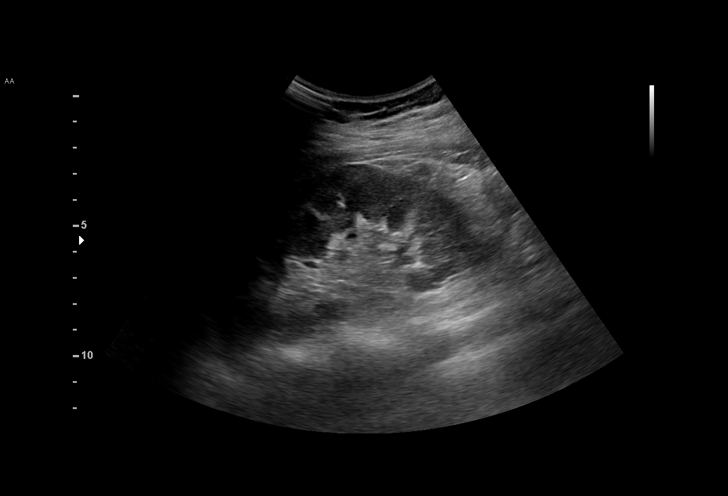
[im 25/32]
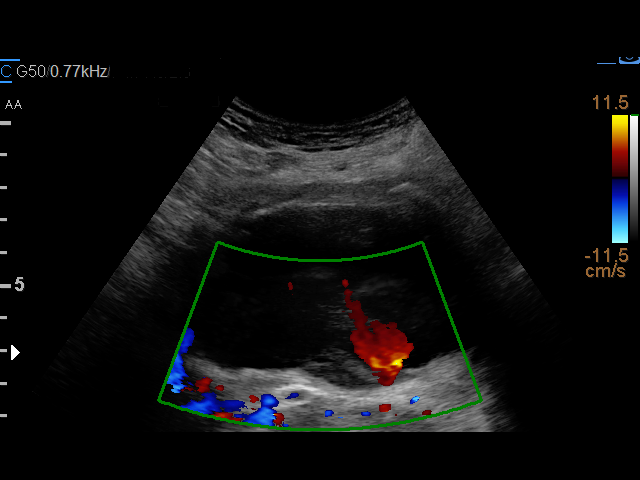
[im 26/32]
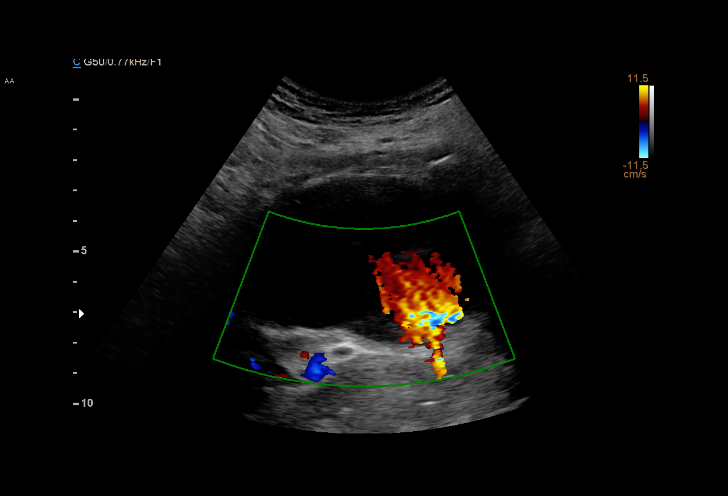
[im 29/32]
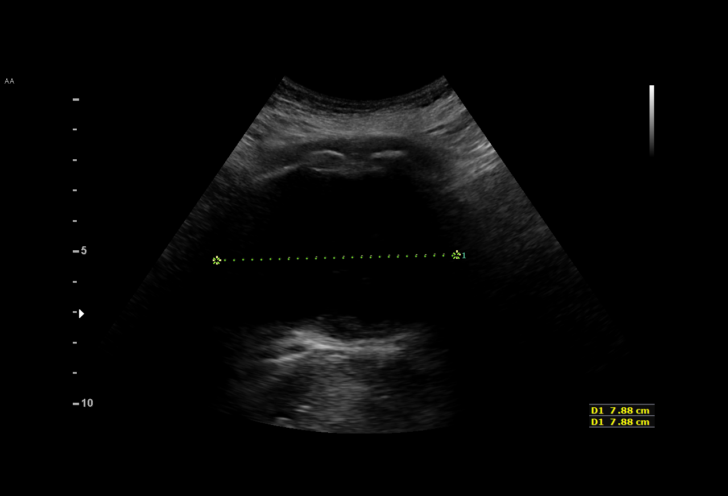
[im 32/32]
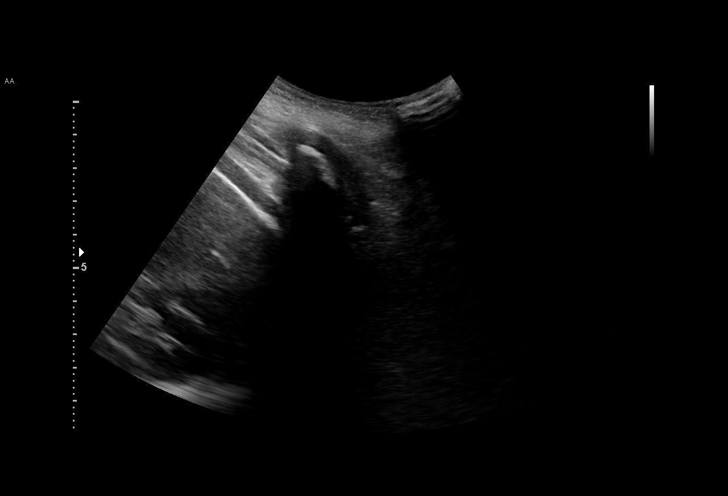

[15 of 25 positions shown; findings below may reference images not displayed]

FINDINGS: Right Kidney:

Length: 11.9 cm. Echogenicity within normal limits. No mass.
Moderate right hydronephrosis and hydroureter. Debris in the renal
collecting system cannot be excluded.

Left Kidney:

Length: 11.1 cm. Echogenicity within normal limits. No mass or
hydronephrosis visualized.

Bladder:

No bladder distention. Left ureteral jet noted. Right ureteral jet
not noted. Pregnancy is noted.
IMPRESSION: Moderate right hydronephrosis and hydroureter. Debris in the right
renal collecting system cannot be excluded .
